# Patient Record
Sex: Female | Born: 1985 | Race: White | Hispanic: No | Marital: Single | Smoking: Current every day smoker
Health system: Southern US, Community
[De-identification: ages and names within clinical notes are randomized; demographics above are authoritative.]

## PROBLEM LIST (undated history)

## (undated) ENCOUNTER — Inpatient Hospital Stay (HOSPITAL_COMMUNITY): Payer: Self-pay

## (undated) DIAGNOSIS — I10 Essential (primary) hypertension: Secondary | ICD-10-CM

## (undated) DIAGNOSIS — F329 Major depressive disorder, single episode, unspecified: Secondary | ICD-10-CM

## (undated) DIAGNOSIS — G43909 Migraine, unspecified, not intractable, without status migrainosus: Secondary | ICD-10-CM

## (undated) DIAGNOSIS — F32A Depression, unspecified: Secondary | ICD-10-CM

## (undated) HISTORY — PX: MOUTH SURGERY: SHX715

## (undated) HISTORY — PX: HERNIA REPAIR: SHX51

## (undated) HISTORY — PX: APPENDECTOMY: SHX54

## (undated) HISTORY — PX: ANKLE SURGERY: SHX546

---

## 2006-04-25 ENCOUNTER — Emergency Department (HOSPITAL_COMMUNITY): Admission: EM | Admit: 2006-04-25 | Discharge: 2006-04-26 | Payer: Self-pay | Admitting: Emergency Medicine

## 2006-05-09 ENCOUNTER — Emergency Department (HOSPITAL_COMMUNITY): Admission: EM | Admit: 2006-05-09 | Discharge: 2006-05-09 | Payer: Self-pay | Admitting: Emergency Medicine

## 2006-06-29 ENCOUNTER — Emergency Department (HOSPITAL_COMMUNITY): Admission: EM | Admit: 2006-06-29 | Discharge: 2006-06-30 | Payer: Self-pay | Admitting: Emergency Medicine

## 2006-07-19 ENCOUNTER — Emergency Department (HOSPITAL_COMMUNITY): Admission: EM | Admit: 2006-07-19 | Discharge: 2006-07-19 | Payer: Self-pay | Admitting: Emergency Medicine

## 2006-08-06 ENCOUNTER — Emergency Department (HOSPITAL_COMMUNITY): Admission: EM | Admit: 2006-08-06 | Discharge: 2006-08-06 | Payer: Self-pay | Admitting: Emergency Medicine

## 2006-08-30 HISTORY — PX: APPENDECTOMY: SHX54

## 2006-09-05 ENCOUNTER — Emergency Department (HOSPITAL_COMMUNITY): Admission: EM | Admit: 2006-09-05 | Discharge: 2006-09-05 | Payer: Self-pay | Admitting: Emergency Medicine

## 2006-09-20 ENCOUNTER — Encounter: Admission: RE | Admit: 2006-09-20 | Discharge: 2006-09-20 | Payer: Self-pay | Admitting: Internal Medicine

## 2006-10-13 ENCOUNTER — Emergency Department (HOSPITAL_COMMUNITY): Admission: EM | Admit: 2006-10-13 | Discharge: 2006-10-13 | Payer: Self-pay | Admitting: Emergency Medicine

## 2006-10-23 ENCOUNTER — Emergency Department (HOSPITAL_COMMUNITY): Admission: EM | Admit: 2006-10-23 | Discharge: 2006-10-23 | Payer: Self-pay | Admitting: Emergency Medicine

## 2006-10-28 ENCOUNTER — Ambulatory Visit (HOSPITAL_COMMUNITY): Admission: RE | Admit: 2006-10-28 | Discharge: 2006-10-28 | Payer: Self-pay | Admitting: Gastroenterology

## 2006-11-09 ENCOUNTER — Emergency Department (HOSPITAL_COMMUNITY): Admission: EM | Admit: 2006-11-09 | Discharge: 2006-11-10 | Payer: Self-pay | Admitting: Emergency Medicine

## 2006-11-11 ENCOUNTER — Emergency Department (HOSPITAL_COMMUNITY): Admission: EM | Admit: 2006-11-11 | Discharge: 2006-11-11 | Payer: Self-pay | Admitting: Emergency Medicine

## 2006-11-15 ENCOUNTER — Emergency Department (HOSPITAL_COMMUNITY): Admission: EM | Admit: 2006-11-15 | Discharge: 2006-11-15 | Payer: Self-pay | Admitting: Emergency Medicine

## 2006-12-07 ENCOUNTER — Emergency Department (HOSPITAL_COMMUNITY): Admission: EM | Admit: 2006-12-07 | Discharge: 2006-12-07 | Payer: Self-pay | Admitting: Emergency Medicine

## 2006-12-21 ENCOUNTER — Emergency Department (HOSPITAL_COMMUNITY): Admission: EM | Admit: 2006-12-21 | Discharge: 2006-12-21 | Payer: Self-pay | Admitting: Emergency Medicine

## 2006-12-29 ENCOUNTER — Encounter: Admission: RE | Admit: 2006-12-29 | Discharge: 2006-12-29 | Payer: Self-pay | Admitting: General Surgery

## 2007-01-04 ENCOUNTER — Ambulatory Visit (HOSPITAL_COMMUNITY): Admission: RE | Admit: 2007-01-04 | Discharge: 2007-01-04 | Payer: Self-pay | Admitting: General Surgery

## 2007-01-04 ENCOUNTER — Encounter (INDEPENDENT_AMBULATORY_CARE_PROVIDER_SITE_OTHER): Payer: Self-pay | Admitting: Specialist

## 2007-01-05 ENCOUNTER — Inpatient Hospital Stay (HOSPITAL_COMMUNITY): Admission: EM | Admit: 2007-01-05 | Discharge: 2007-01-11 | Payer: Self-pay | Admitting: Emergency Medicine

## 2007-03-12 ENCOUNTER — Emergency Department (HOSPITAL_COMMUNITY): Admission: EM | Admit: 2007-03-12 | Discharge: 2007-03-12 | Payer: Self-pay | Admitting: Emergency Medicine

## 2007-03-20 ENCOUNTER — Emergency Department (HOSPITAL_COMMUNITY): Admission: EM | Admit: 2007-03-20 | Discharge: 2007-03-20 | Payer: Self-pay | Admitting: Emergency Medicine

## 2007-04-17 ENCOUNTER — Emergency Department (HOSPITAL_COMMUNITY): Admission: EM | Admit: 2007-04-17 | Discharge: 2007-04-18 | Payer: Self-pay | Admitting: Emergency Medicine

## 2007-09-11 ENCOUNTER — Emergency Department (HOSPITAL_COMMUNITY): Admission: EM | Admit: 2007-09-11 | Discharge: 2007-09-12 | Payer: Self-pay | Admitting: Emergency Medicine

## 2007-10-06 ENCOUNTER — Emergency Department (HOSPITAL_COMMUNITY): Admission: EM | Admit: 2007-10-06 | Discharge: 2007-10-06 | Payer: Self-pay | Admitting: Emergency Medicine

## 2008-06-18 IMAGING — RF DG BE W/ CM - WO/W KUB
14 of 23 series · 14 of 24 positions shown · non-contrast
Comparison: none

CLINICAL DATA: Right lower quadrant pain. Evaluate for possible diverticulum.

 KUB WITH BARIUM ENEMA FULL COLUMN:

[Series 1: run · 1 of 3 slices shown (1 of 10)]
[im 1/3]
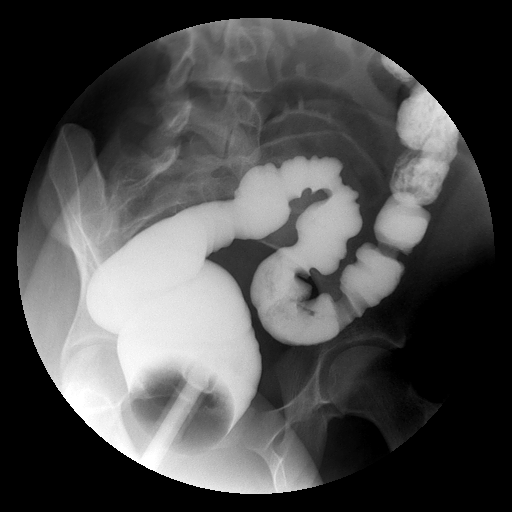

[Series 2: run · 1 of 3 slices shown (2 of 10)]
[im 1/3]
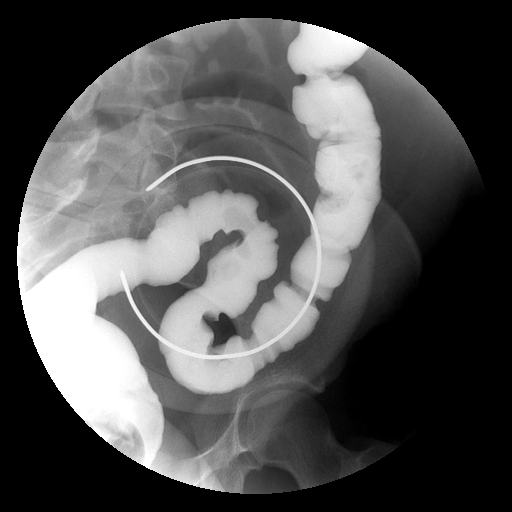

[Series 4: run · 1 of 3 slices shown (3 of 10)]
[im 1/3]
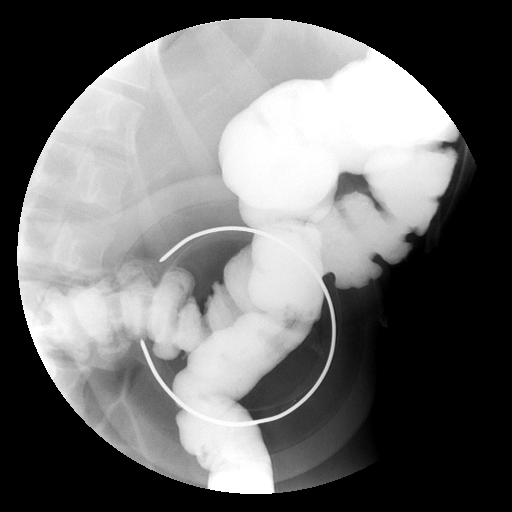

[Series 6: run · 1 of 3 slices shown (4 of 10)]
[im 1/3]
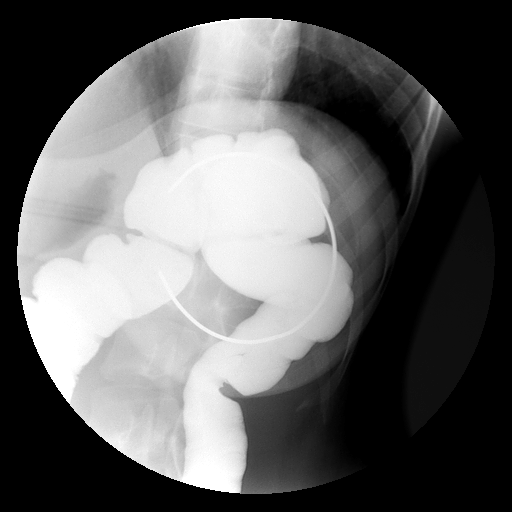

[Series 7: run · 1 of 3 slices shown (5 of 10)]
[im 1/3]
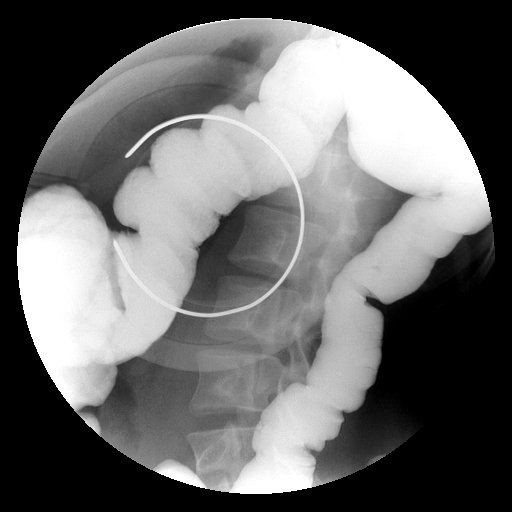

[Series 9: run · 1 of 3 slices shown (6 of 10)]
[im 1/3]
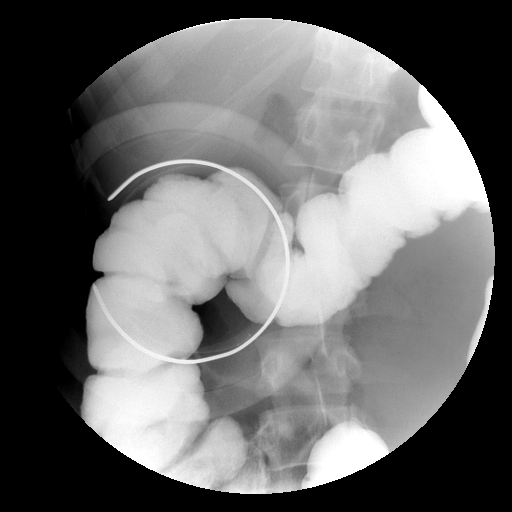

[Series 11: run · 1 of 3 slices shown (7 of 10)]
[im 1/3]
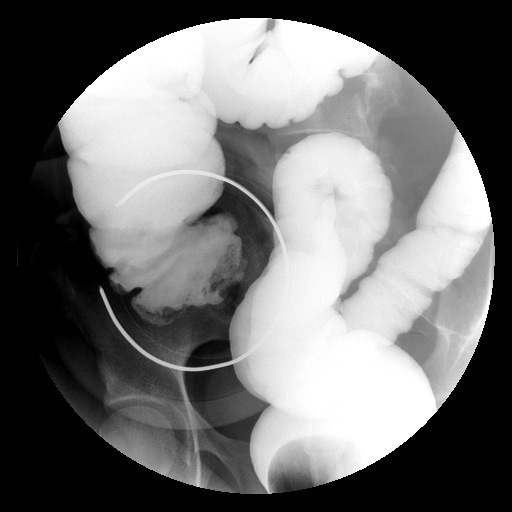

[Series 12: run · 1 of 3 slices shown (8 of 10)]
[im 1/3]
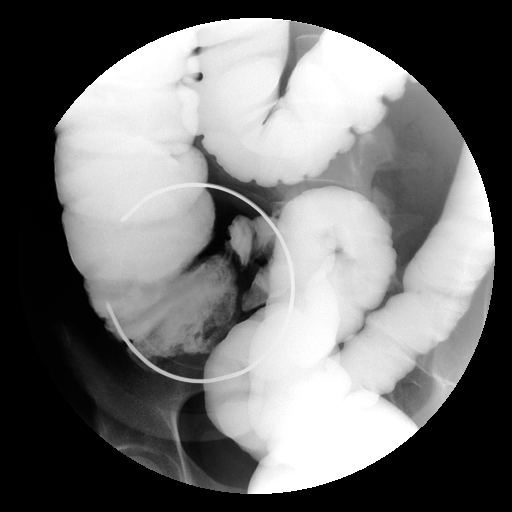

[Series 14: run · 1 of 3 slices shown (9 of 10)]
[im 1/3]
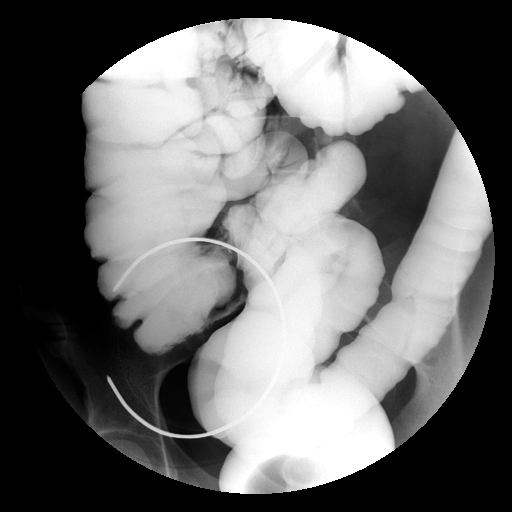

[Series 16: run · 1 of 3 slices shown (10 of 10)]
[im 1/3]
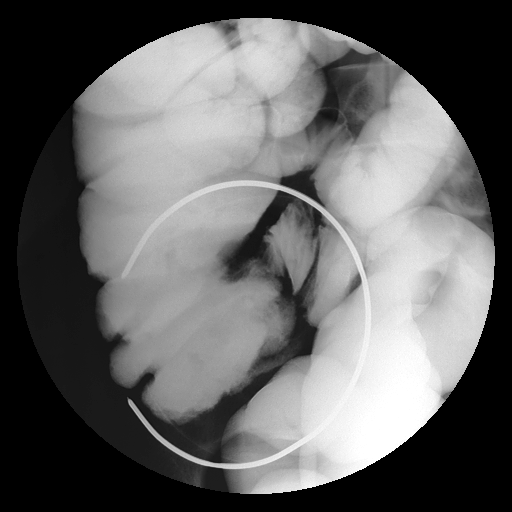

[Series 1001: view not recorded · 0.20mm/px · 1 of 1 slices shown (1 of 4)]
[im 1/1]
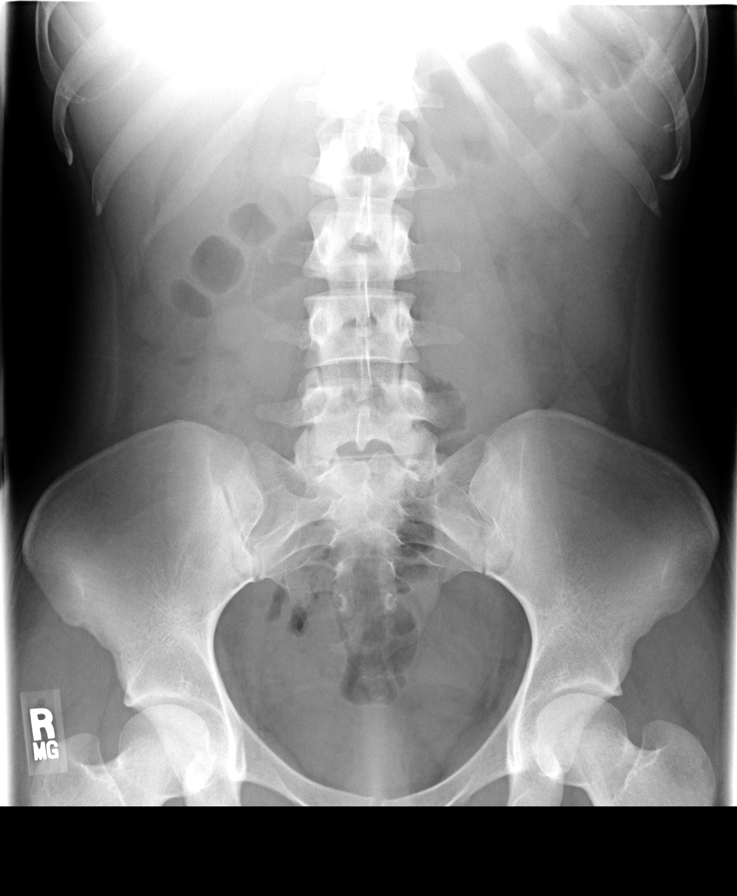

[Series 1002: view not recorded · 0.20mm/px · 1 of 1 slices shown (2 of 4)]
[im 1/1]
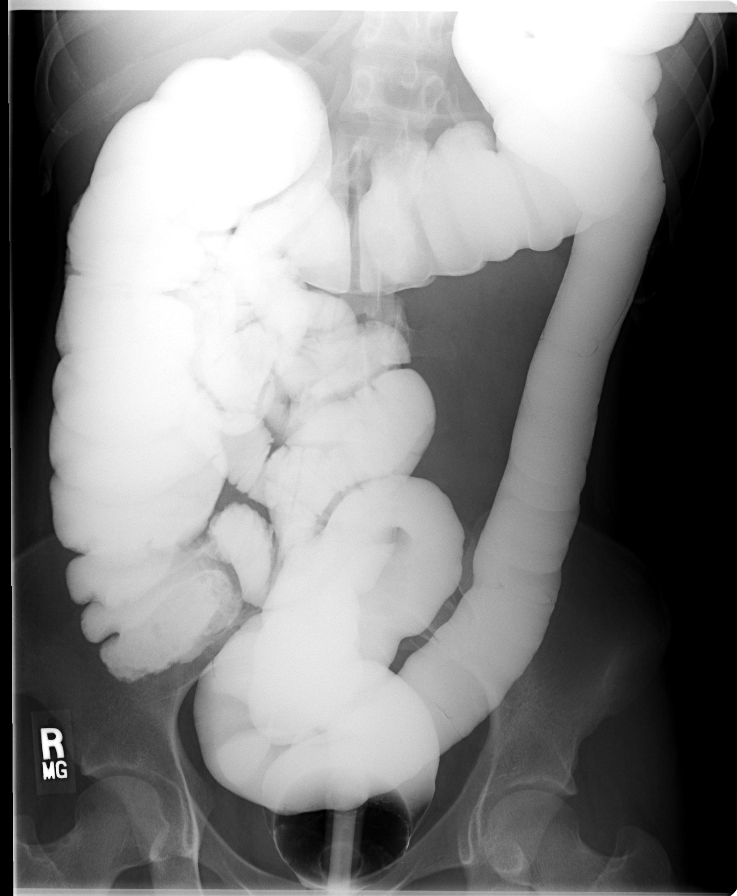

[Series 1004: view not recorded · 0.20mm/px · 1 of 1 slices shown (3 of 4)]
[im 1/1]
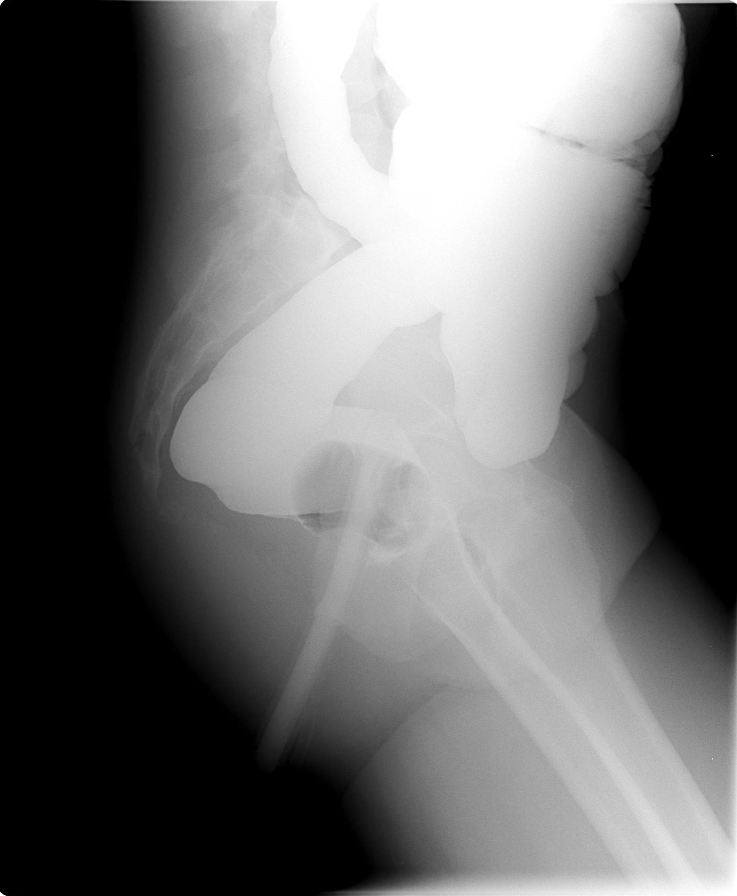

[Series 1006: view not recorded · 0.20mm/px · 1 of 1 slices shown (4 of 4)]
[im 1/1]
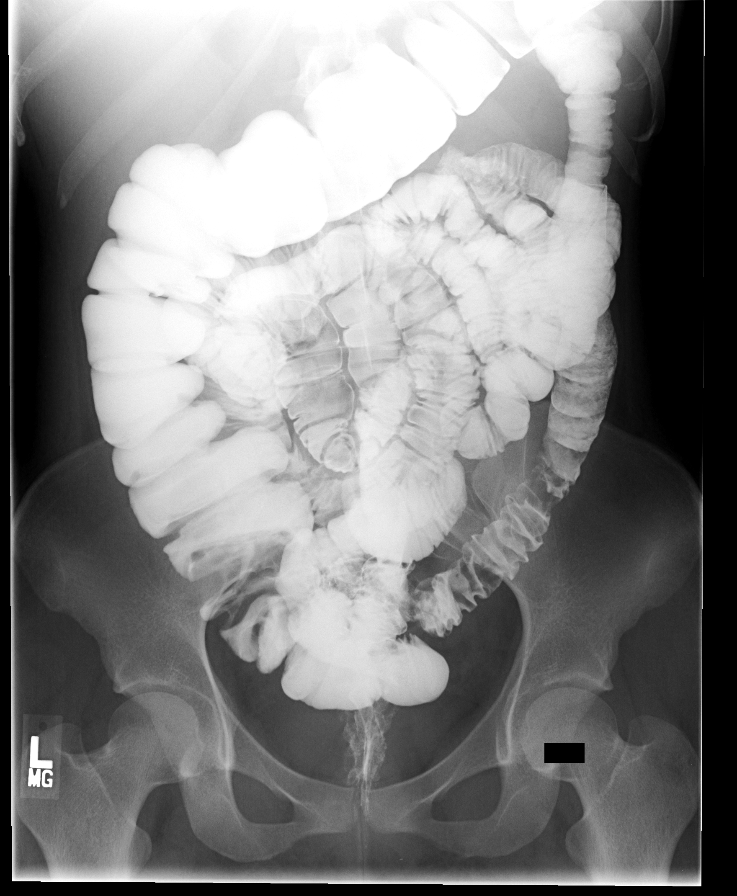

[14 of 24 positions shown; findings below may reference images not displayed]

FINDINGS: KUB:  A preliminary film of the abdomen shows a nonspecific bowel gas pattern.  No opaque calculi are noted.  
 BARIUM ENEMA:  A single contrast barium enema was performed.  There is some retained feces throughout the colon making exclusions of polyps difficult.  However, no definite persistent polypoid lesion or constricting lesion is seen.  There is some feces in the base of the cecum making that area difficult to assess.  However, no cecal diverticulum is noted and the terminal ileum is well-visualized and appears normal.
IMPRESSION: 1.  Negative barium enema.  Some retained feces makes exclusion of small polyps difficult, but no persistent polypoid lesion or constricting lesion is seen.
 2.  Terminal ileum appears normal.

## 2008-08-28 ENCOUNTER — Ambulatory Visit: Payer: Self-pay | Admitting: Family

## 2008-08-28 ENCOUNTER — Inpatient Hospital Stay (HOSPITAL_COMMUNITY): Admission: AD | Admit: 2008-08-28 | Discharge: 2008-08-28 | Payer: Self-pay | Admitting: Obstetrics & Gynecology

## 2008-08-30 HISTORY — PX: HERNIA REPAIR: SHX51

## 2008-09-07 ENCOUNTER — Inpatient Hospital Stay (HOSPITAL_COMMUNITY): Admission: AD | Admit: 2008-09-07 | Discharge: 2008-09-07 | Payer: Self-pay | Admitting: Obstetrics & Gynecology

## 2008-09-07 ENCOUNTER — Ambulatory Visit: Payer: Self-pay | Admitting: Advanced Practice Midwife

## 2008-09-23 ENCOUNTER — Inpatient Hospital Stay (HOSPITAL_COMMUNITY): Admission: AD | Admit: 2008-09-23 | Discharge: 2008-09-25 | Payer: Self-pay | Admitting: Obstetrics & Gynecology

## 2008-09-23 ENCOUNTER — Ambulatory Visit: Payer: Self-pay | Admitting: Obstetrics and Gynecology

## 2009-01-21 ENCOUNTER — Ambulatory Visit (HOSPITAL_COMMUNITY): Admission: RE | Admit: 2009-01-21 | Discharge: 2009-01-21 | Payer: Self-pay | Admitting: General Surgery

## 2009-01-28 ENCOUNTER — Emergency Department (HOSPITAL_COMMUNITY): Admission: EM | Admit: 2009-01-28 | Discharge: 2009-01-28 | Payer: Self-pay | Admitting: Emergency Medicine

## 2009-02-04 ENCOUNTER — Emergency Department (HOSPITAL_COMMUNITY): Admission: EM | Admit: 2009-02-04 | Discharge: 2009-02-04 | Payer: Self-pay | Admitting: Emergency Medicine

## 2009-02-14 ENCOUNTER — Emergency Department (HOSPITAL_COMMUNITY): Admission: EM | Admit: 2009-02-14 | Discharge: 2009-02-14 | Payer: Self-pay | Admitting: Emergency Medicine

## 2009-02-21 ENCOUNTER — Ambulatory Visit (HOSPITAL_COMMUNITY): Admission: RE | Admit: 2009-02-21 | Discharge: 2009-02-21 | Payer: Self-pay | Admitting: Obstetrics

## 2009-02-21 ENCOUNTER — Emergency Department (HOSPITAL_COMMUNITY): Admission: EM | Admit: 2009-02-21 | Discharge: 2009-02-21 | Payer: Self-pay | Admitting: Emergency Medicine

## 2009-03-21 ENCOUNTER — Encounter: Admission: RE | Admit: 2009-03-21 | Discharge: 2009-03-21 | Payer: Self-pay | Admitting: General Surgery

## 2009-06-04 ENCOUNTER — Emergency Department (HOSPITAL_COMMUNITY): Admission: EM | Admit: 2009-06-04 | Discharge: 2009-06-04 | Payer: Self-pay | Admitting: Emergency Medicine

## 2009-06-27 ENCOUNTER — Ambulatory Visit (HOSPITAL_COMMUNITY): Admission: RE | Admit: 2009-06-27 | Discharge: 2009-06-27 | Payer: Self-pay | Admitting: Obstetrics

## 2009-07-28 ENCOUNTER — Emergency Department (HOSPITAL_COMMUNITY): Admission: EM | Admit: 2009-07-28 | Discharge: 2009-07-28 | Payer: Self-pay | Admitting: Emergency Medicine

## 2009-08-18 ENCOUNTER — Ambulatory Visit (HOSPITAL_COMMUNITY): Admission: RE | Admit: 2009-08-18 | Discharge: 2009-08-18 | Payer: Self-pay | Admitting: Obstetrics

## 2009-11-05 ENCOUNTER — Emergency Department (HOSPITAL_COMMUNITY): Admission: EM | Admit: 2009-11-05 | Discharge: 2009-11-05 | Payer: Self-pay | Admitting: Emergency Medicine

## 2010-09-20 ENCOUNTER — Encounter: Payer: Self-pay | Admitting: Obstetrics

## 2010-09-20 ENCOUNTER — Encounter: Payer: Self-pay | Admitting: Internal Medicine

## 2010-12-02 LAB — URINALYSIS, ROUTINE W REFLEX MICROSCOPIC
Bilirubin Urine: NEGATIVE
Glucose, UA: NEGATIVE mg/dL
Hgb urine dipstick: NEGATIVE
Ketones, ur: NEGATIVE mg/dL
Protein, ur: NEGATIVE mg/dL

## 2010-12-07 LAB — URINALYSIS, ROUTINE W REFLEX MICROSCOPIC
Bilirubin Urine: NEGATIVE
Glucose, UA: NEGATIVE mg/dL
Glucose, UA: NEGATIVE mg/dL
Glucose, UA: NEGATIVE mg/dL
Hgb urine dipstick: NEGATIVE
Hgb urine dipstick: NEGATIVE
Hgb urine dipstick: NEGATIVE
Ketones, ur: NEGATIVE mg/dL
Nitrite: NEGATIVE
Protein, ur: NEGATIVE mg/dL
Specific Gravity, Urine: 1.028 (ref 1.005–1.030)
Specific Gravity, Urine: 1.015 (ref 1.005–1.030)
Urobilinogen, UA: 1 mg/dL (ref 0.0–1.0)
pH: 6.5 (ref 5.0–8.0)

## 2010-12-07 LAB — URINE MICROSCOPIC-ADD ON

## 2010-12-07 LAB — DIFFERENTIAL
Basophils Absolute: 0.1 10*3/uL (ref 0.0–0.1)
Basophils Relative: 0 % (ref 0–1)
Eosinophils Absolute: 0.2 10*3/uL (ref 0.0–0.7)
Eosinophils Relative: 2 % (ref 0–5)
Lymphocytes Relative: 26 % (ref 12–46)
Lymphs Abs: 2.3 10*3/uL (ref 0.7–4.0)
Monocytes Absolute: 0.4 10*3/uL (ref 0.1–1.0)
Monocytes Relative: 5 % (ref 3–12)
Monocytes Relative: 6 % (ref 3–12)
Neutro Abs: 3.8 10*3/uL (ref 1.7–7.7)
Neutrophils Relative %: 56 % (ref 43–77)

## 2010-12-07 LAB — GC/CHLAMYDIA PROBE AMP, GENITAL: GC Probe Amp, Genital: NEGATIVE

## 2010-12-07 LAB — PREGNANCY, URINE: Preg Test, Ur: NEGATIVE

## 2010-12-07 LAB — COMPREHENSIVE METABOLIC PANEL
AST: 17 U/L (ref 0–37)
Albumin: 4.7 g/dL (ref 3.5–5.2)
BUN: 9 mg/dL (ref 6–23)
CO2: 24 mEq/L (ref 19–32)
Calcium: 9.8 mg/dL (ref 8.4–10.5)
Chloride: 109 mEq/L (ref 96–112)
Creatinine, Ser: 0.73 mg/dL (ref 0.4–1.2)
GFR calc Af Amer: >60 mL/min (ref >60)
GFR calc non Af Amer: >60 mL/min (ref >60)
Glucose, Bld: 87 mg/dL (ref 70–99)
Potassium: 3.8 mEq/L (ref 3.5–5.1)
Total Bilirubin: 0.6 mg/dL (ref 0.3–1.2)
Total Protein: 7.7 g/dL (ref 6.0–8.3)
Total Protein: 7.9 g/dL (ref 6.0–8.3)

## 2010-12-07 LAB — CBC
HCT: 37.4 % (ref 36.0–46.0)
Hemoglobin: 12.8 g/dL (ref 12.0–15.0)
MCHC: 34.1 g/dL (ref 30.0–36.0)
MCV: 94.3 fL (ref 78.0–100.0)
MCV: 94.6 fL (ref 78.0–100.0)
Platelets: 359 10*3/uL (ref 150–400)
RBC: 3.96 MIL/uL (ref 3.87–5.11)
RDW: 13.8 % (ref 11.5–15.5)
WBC: 7.6 10*3/uL (ref 4.0–10.5)

## 2010-12-07 LAB — WET PREP, GENITAL: Yeast Wet Prep HPF POC: NONE SEEN

## 2010-12-07 LAB — URINE CULTURE: Colony Count: >=100000

## 2010-12-07 LAB — RPR: RPR Ser Ql: NONREACTIVE

## 2010-12-08 LAB — CBC
Hemoglobin: 12.3 g/dL (ref 12.0–15.0)
MCHC: 34.6 g/dL (ref 30.0–36.0)
Platelets: 348 10*3/uL (ref 150–400)
RDW: 14.9 % (ref 11.5–15.5)

## 2010-12-08 LAB — URINALYSIS, ROUTINE W REFLEX MICROSCOPIC
Glucose, UA: NEGATIVE mg/dL
Hgb urine dipstick: NEGATIVE
Ketones, ur: NEGATIVE mg/dL
Protein, ur: NEGATIVE mg/dL
pH: 6.5 (ref 5.0–8.0)

## 2010-12-08 LAB — DIFFERENTIAL
Basophils Relative: 0 % (ref 0–1)
Eosinophils Absolute: 0.1 10*3/uL (ref 0.0–0.7)
Lymphs Abs: 1.9 10*3/uL (ref 0.7–4.0)
Monocytes Absolute: 0.3 10*3/uL (ref 0.1–1.0)
Monocytes Relative: 7 % (ref 3–12)

## 2010-12-08 LAB — COMPREHENSIVE METABOLIC PANEL
ALT: 8 U/L (ref 0–35)
Albumin: 4 g/dL (ref 3.5–5.2)
Alkaline Phosphatase: 48 U/L (ref 39–117)
Calcium: 9.1 mg/dL (ref 8.4–10.5)
GFR calc Af Amer: >60 mL/min (ref >60)
Potassium: 3.5 mEq/L (ref 3.5–5.1)
Sodium: 141 mEq/L (ref 135–145)
Total Protein: 6.5 g/dL (ref 6.0–8.3)

## 2010-12-14 LAB — COMPREHENSIVE METABOLIC PANEL
ALT: 9 U/L (ref 0–35)
AST: 14 U/L (ref 0–37)
Albumin: 2.8 g/dL — ABNORMAL LOW (ref 3.5–5.2)
Alkaline Phosphatase: 106 U/L (ref 39–117)
Calcium: 8.5 mg/dL (ref 8.4–10.5)
GFR calc Af Amer: >60 mL/min (ref >60)
Potassium: 3.4 mEq/L — ABNORMAL LOW (ref 3.5–5.1)
Sodium: 138 mEq/L (ref 135–145)
Total Protein: 6.2 g/dL (ref 6.0–8.3)

## 2010-12-14 LAB — CULTURE, BETA STREP (GROUP B ONLY)

## 2010-12-14 LAB — CBC
Hemoglobin: 10.3 g/dL — ABNORMAL LOW (ref 12.0–15.0)
MCHC: 34.4 g/dL (ref 30.0–36.0)
RDW: 14.5 % (ref 11.5–15.5)

## 2010-12-14 LAB — GLUCOSE, CAPILLARY: Glucose-Capillary: 76 mg/dL (ref 70–99)

## 2011-01-12 NOTE — Consult Note (Signed)
Carmen Noble, Carmen Noble              ACCOUNT NO.:  [id number]   MEDICAL RECORD NO.:  [id number]          PATIENT TYPE:  INP   LOCATION:  1338                         FACILITY:  [facility]   PHYSICIAN:  Bernette Redbird, M.D.   DATE OF BIRTH:  [birth year]   DATE OF CONSULTATION:  01/09/2007  DATE OF DISCHARGE:                                 CONSULTATION   This is a gastroenterology consultation.   Dr. Derrell Lolling asked [place] to see this 25 year old female because of abdominal  pain and diarrhea.   [name] is known to my partner, Dr. Randa Evens, who has followed her for  some time and has attempted to find an etiology for her significant  recurring right lower quadrant pain, but has been unable to do so  despite extensive testing.  This has included a total of 4 CT scans of  the abdomen and pelvis since August, most recently on the current  admission, which have shown on a consistent basis a question of some  distal ileal thickening, although this was not shown to be abnormal on a  small bowel series, nor was colonoscopy to the terminal ileum revealing  for any obvious colitis or ileitis.   Because of her ongoing symptoms and an ongoing narcotic requirement, the  patient underwent laparoscopy by Dr. Derrell Lolling about a week ago which was  normal.  The appendix was removed empirically and was normal both to  inspection and histologically.  The small bowel was run and no Meckel's  diverticulum or evidence of Crohn disease or problem with adhesions was  identified.   The patient has been seen by Dr. Coral Ceo with a questionable  diagnosis of PID and a basically negative pelvic ultrasound.   It is not clear if the patient has had testing for celiac disease from  the information that I have currently available.   The patient describes her symptoms as not having improved since August  and they have been especially bad recently.  The patient had her surgery  about a week ago, was discharged home for a  few days and then  readmitted.  The pain is not present on a daily basis but rather  sporadic and probably effects her roughly 15 days out of a month.  Typically it bothers her mostly in the morning, although here in the  hospital it has been pretty much around the clock.  Typically, it lasts  most of the morning, which is to say for several hours, and is of  sufficient severity to interfere with normal activities to some degree.  It is usually sharp and stabbing and somewhat crampy in character and is  localized in the right lower quadrant over an area of about 10 cm or so,  without any radiation to the groin, back, etc.  It seems to be somewhat  worse during her menstrual cycle and can be helped a little bit by  splinting the abdomen with her hand.  There is no obvious association  with meals.   The patient reports a roughly 25-pound weight loss over the past year.   The symptoms do  not bother her after going to sleep at night.   In addition to the pain, the patient has had altered bowel habit.  Instead of her usual baseline bowel habit of 2 well-formed bowel  movements a day, the patient since January has been having roughly 6  bowel movements a day, predominantly in the morning, typically  progressively looser with each successive bowel movement.  The bowel  movements have been nonbloody and are not associated with significant  cramps.   Constitutionally, the patient says her appetite is fine despite the  significant weight loss.  No problem with constitutional symptoms such  as arthralgias, skin rashes, etc.   Of note, the patient has used large amounts of Vicodin, perhaps 300  Vicodin tablets in the past 6 weeks, per discussion with Dr. Derrell Lolling when  he phoned in the consult.   PAST MEDICAL HISTORY:  ALLERGY TO PENICILLIN.   CURRENT MEDICATIONS IN THE HOSPITAL:  Cipro, morphine, Zofran, Protonix.   OPERATIONS:  None other than the above-mentioned laparoscopy about a  week  ago.   CHRONIC MEDICAL ILLNESSES:  None.   HABITS:  Smokes about one pack per day, nondrinker, no street drugs.   FAMILY HISTORY:  Negative for GI illnesses.   SOCIAL HISTORY:  The patient lives with her parents.  She does not work.  She has a 88-year-old child.  Her sister and her sister's children have  moved in with them as well.   REVIEW OF SYSTEMS:  See HPI.   PHYSICAL EXAMINATION:  Unrevealing.  A pleasant, healthy-appearing  Caucasian female in no evident distress appearing neither anxious nor  depressed.  She is anicteric and without pallor.  CHEST:  Clear.  HEART:  Normal  ABDOMEN:  Normal bowel sounds, is soft, nondistended without  organomegaly or any significant guarding or significant tenderness.  She  is slightly touchy to palpation but nothing impressive.   LABORATORIES:  White count 3 days ago was 6000; today it is 12,600.  Hemoglobin was 11.3, now 13.1.  Platelets 316,000, 82 polys, 9 lymphs, 8  monocytes.  Sed rate normal at 19.  Chemistry panel unremarkable  including normal liver chemistries and normal albumin.  Urine pregnancy  test negative.  Urinalysis showed trace ketones on admission and trace  leukocytes.   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   RECOMMENDATIONS:  1. Await stool study results.  If they are negative, I would stop the      patient's Cipro.  2. I would consider video capsule endoscopy although I think the yield      would be very low.  3. I had a long talk with the patient regarding chronic abdominal pain      and its management and how it differs from acute abdominal pain.  4. As an outpatient, the patient could be tried on Align probiotic      therapy to see if it might help her altered bowel habits. 5. I would probably check a tissue transglutaminase to help be sure      that this is not celiac disease  (doubt).  6. I would consider pain clinic consultation to help the patient      transition from narcotic analgesics to centrally acting pain-      modulating therapy (for example, low dose antidepressant      medicines), as well as  consideration of alternative or adjunctive      measures such as acupuncture, relaxation therapy, etc.  7. It might be reasonable to ask Dr. Jeanie Sewer of liaison psychiatry      to screen the patient for depressive and/or somatoform disorders      that might be contributing to her pain presentation, although I did      not get an impression, from my visit with the patient this evening,      that such problems are present.  8. From our standpoint, it would be okay to advance the patient's diet      from clear liquids to a low residue diet at this time; she states      she is hungry.  9. I emphasized to the patient that there is a strong possibility that      we will not find a specific cause for her pain, that her prognosis      is good over time that it will not evolve into a dangerous or      severe as-yet undiagnosed pathologic disorder, and that the goal of      therapy should be oriented primarily toward modulation of the pain      but not elimination of the pain, and improving her functional      status and ability to cope with the symptoms.   A lot of this patient's management will probably need to be continued on  an outpatient basis under the direction of her primary  gastroenterologist, Dr. Vilinda Boehringer.           ______________________________  Bernette Redbird, M.D.     RB/MEDQ  D:  01/09/2007  T:  01/10/2007  Job:  [id number]   cc:   Fayrene Fearing L. Malon Kindle., M.D.  Fax: [phone]   Charles A. Clearance Coots, M.D.  Fax: [phone]   Angelia Mould. Derrell Lolling, M.D.  [address]  [place]  [place] [address]   Fleet Contras, M.D.  Fax: [phone]

## 2011-01-12 NOTE — Op Note (Signed)
Carmen Noble, Carmen Noble              ACCOUNT NO.:  [id number]   MEDICAL RECORD NO.:  [id number]          PATIENT TYPE:  AMB   LOCATION:  DAY                          FACILITY:  [facility]   PHYSICIAN:  Angelia Mould. Derrell Lolling, M.D.DATE OF BIRTH:  [birth year]   DATE OF PROCEDURE:  01/21/2009  DATE OF DISCHARGE:                               OPERATIVE REPORT   PREOPERATIVE DIAGNOSIS:  Ventral incisional hernia.   POSTOPERATIVE DIAGNOSIS:  Ventral incisional hernia.   OPERATION PERFORMED:  Laparoscopic repair of ventral incisional hernia  with Parietex Composite Mesh (12-cm circular).   SURGEON:  Dr. Claud Kelp.   OPERATIVE INDICATIONS:  This is a 25 year old Caucasian female who has  had several pregnancies.  She was evaluated 2 years ago for chronic  right lower quadrant pain.  After exhaustive workup, we did an elective  laparoscopic appendectomy.  Her appendix was normal.  There were no  other abnormalities.  She recovered from that surgery.  She has had no  more GI problems.  She presented recently with a painful bulge just  above the umbilicus, and on exam, she has a small hernia above her  umbilicus in the midline.  This is thought to possibly be related to her  laparoscopic surgery.  She is brought to the operating room electively  for repair because this is painful.   OPERATIVE FINDINGS:  There was a small ventral incisional hernia above  the umbilicus in the midline.  The falciform ligament was quite large  and came all the way down to the hernia and required extensive  debridement of the falciform ligament to get the mesh properly sutured  in place.  The defect in the fascia was probably less than 3 cm.  We saw  no other evidence of hernias elsewhere.   OPERATIVE TECHNIQUE:  Following the induction of general endotracheal  anesthesia, the patient's bladder was emptied with in-and-out  catheterization.  Intravenous antibiotics were given.  The abdomen was  prepped and draped in a  sterile fashion.  The patient identified as  correct patient and correct procedure and correct site.  Marcaine 0.50%  with epinephrine was used as local infiltration anesthetic.  A 5-mm  optical port was placed in the left subcostal region.  Entry was under  direct vision.  Entry was unremarkable and without trauma.  Pneumoperitoneum was created.  Video cam was inserted.  There is no sign  of any injury to any of the abdominal viscera, and there was no  bleeding.  An 11-mm trocar was placed in the left lateral abdomen and a  5-mm trocar was placed in left lower quadrant.   We surveyed the entire abdomen and pelvis and found no other  abnormalities.  We examined the hernia defect.  We used a harmonic  scalpel to take down the falciform ligament which was quite large and  bulky and took this all the way back up to just below the xiphoid  process.  We had good hemostasis.  We marked the defect with a spinal  needle and found that it was about 3 cm at most.  We  chose to use a 12-  cm diameter Parietex Composite Mesh so that it would cover the defect as  well as the umbilical area extensively.  This mesh was brought to the  operative field.  We drew a circular template on the abdominal wall and  marked the mesh for 6 equidistant suture fixation sites.  We used 0  Novofil and placed 6 Novofil sutures of the edge of the mesh on the  rough side.  We then moistened the mesh, rolled it up and then inserted  into the abdominal cavity.  We then spread the mesh out and oriented  according to the templated picture.  We made small incisions at the 6  suture fixation sites, and we brought the Novofil sutures up through the  suture fixation sites.  We were careful to take about 1-cm bite of  fascia at each place to get good secure fixation.  After we placed all  of these sutures, we lifted the mesh up.  We could see the smooth  visceral surface of the mesh, and this covered the defect quite nicely.  It  was smooth and had no folds or fluting.  We tied all 6 suture  fixation.  We then used the 5-mm titanium ProTack to fix the mesh to the  abdominal wall further.  We did this in a double crown fashion with an  outer rim and an  inner rim.  The outer rim placed the screw tacks by  about 1 cm apart and internally placed about 6 or 8 screw tacks.  This  secured the mesh quite nicely to the abdominal wall.  There was no  bleeding.  We surveyed the abdomen and saw no evidence of any injury to  any other organ.  The trocars were removed under direct vision.  There  was no bleeding from trocar sites.  The pneumoperitoneum was released.  The skin incisions were closed with subcuticular sutures of 4-0 Monocryl  and Steri-Strips.  Clean bandages were placed, and the patient taken to  the recovery room in stable condition.  Estimated blood loss about 10  mL.  Complications none.  Sponge, needle and instrument counts were  correct.      Angelia Mould. Derrell Lolling, M.D.  Electronically Signed     HMI/MEDQ  D:  01/21/2009  T:  01/21/2009  Job:  [id number]   cc:   Roseanna Rainbow, M.D.  Fax: [phone]

## 2011-01-12 NOTE — Op Note (Signed)
Carmen Noble, Carmen Noble              ACCOUNT NO.:  [id number]   MEDICAL RECORD NO.:  [id number]          PATIENT TYPE:  AMB   LOCATION:  DAY                          FACILITY:  [facility]   PHYSICIAN:  Angelia Mould. Derrell Lolling, M.D.DATE OF BIRTH:  1986-01-15   DATE OF PROCEDURE:  01/04/2007  DATE OF DISCHARGE:                               OPERATIVE REPORT   PREOPERATIVE DIAGNOSIS:  Right lower quadrant pain, uncertain etiology.   POSTOPERATIVE DIAGNOSIS:  1. Right lower quadrant pain, uncertain etiology.  2. Question tiny fibroma of right fallopian tube.  3. Possible early left inguinal hernia.   OPERATION PERFORMED:  1. Diagnostic laparoscopy.  2. Laparoscopic appendectomy.   SURGEON:  Dr. Claud Kelp   FIRST ASSISTANT:  Dr. Harriette Bouillon   OPERATIVE INDICATIONS:  This is a 25 year old white female who gives a 9-  month history of right lower quadrant pain.  She stated this started  rather suddenly one day, it was intermittent at first and is now more  constant and a daily problem.  It waxes and wanes but never goes away.  She states that she occasionally has nausea and vomiting in the morning.  She occasionally has diarrhea.  She has lost 15 pounds over the past  year. Her health was otherwise stable.  She has been evaluated by Dr.  Coral Ceo and actually was treated for PID although the diagnosis  was not clear.  She has had pelvic ultrasounds, ultrasounds of the  gallbladder, CT scans, small-bowel follow-through, colonoscopy, and  barium enema. The CT scan suggested a little bit of thickening of the  terminal ileum but by colonoscopy the terminal ileum was normal and by  barium enema with reflux the terminal ileum was normal. All the small  bowel biopsies and colon biopsies were normal showing no inflammatory  change.  I evaluated her as an outpatient.  She was counseled that the  diagnosis was unclear and whether her pain would resolve after  laparoscopy was unclear.  She  was advised that we would take her  appendix out if no other disease process was found and she was  comfortable with that. She is brought to operating room electively.   OPERATIVE FINDINGS:  There was no clear-cut cause of her pain found.  She may have had a tiny 5 mm fibrous nodule of the fallopian tube on the  right.  She appeared to have a very early left inguinal hernia.  There  were no adhesions.  There was no evidence of endometriosis.  The ovaries  showed no obvious pathology.  The cul-de-sac looked normal.  The  terminal ileum and in fact the entire small bowel was normal.  There was  no inflammatory change.  No evidence of any small bowel diverticula.  The appendix looked normal.  The right colon, transverse colon and  descending colon, sigmoid colon looked normal.  The liver and  gallbladder and stomach looked normal.  There were no adhesions in the  subphrenic spaces.   OPERATIVE TECHNIQUE:  Following induction of general endotracheal  anesthesia a Foley catheter was placed, intravenous antibiotics were  given, the abdomen was prepped and draped, and the patient was  identified as correct patient and correct procedure.  0.5% Marcaine with  epinephrine was used as local infiltration anesthetic.  A transverse  incision was made at the superior rim of the umbilicus.  The fascia was  incised in the midline.  The abdominal cavity entered under direct  vision.  10 mm Hassan cannula was inserted and secured with pursestring  suture of 0 Vicryl.  Pneumoperitoneum was created.  Video cam was  inserted with visualization and findings as described above.  5 mm  trocar was placed in the left mid abdomen and a 12-mm trocar placed in  the left suprapubic area.   We did a thorough exploration of the subphrenic spaces, abdominal cavity  and pelvis with photography taken as mentioned above.  We were only able  to get one copy of the photographs and so they were left on the  patient's  permanent Hospital record.  The findings were described above.  We ran the small bowel from the terminal ileum all the way back to the  ligament of Treitz and saw no pathology.   Since there was no other specific cause of pain found, we elected to  remove the appendix.  We grasped the appendix and lifted it upward.  The  harmonic scalpel was used and we divided the appendiceal mesentery and  appendiceal artery with harmonic scalpel.  We did this in steps until we  had appendix completely skeletonized and could clearly see the junction  of the appendix with the cecum.  An Endo-GIA stapling device was placed  transversely across the base of the appendix.  It was closed, held in  place for 30 seconds, then fired and removed.  The staple line looked  very good, was clean and did not bleed at all.  The appendix was placed  in the specimen bag and removed.  There is absolutely no blood or fluid  present.  The trocars were removed under direct vision.  There is no  bleeding from trocar sites.  Pneumoperitoneum was released.  The fascia  at the umbilicus and the fascia in the suprapubic port site were closed  with 0 Vicryl sutures.  The wounds were irrigated with saline and skin  closed with subcuticular sutures of 4-0 Monocryl and Steri-Strips.  Clean bandages were placed and the patient taken recovery room in stable  condition.  Estimated blood loss was about 10 mL.  Complications none.  Sponge, needle, and instrument counts were correct.      Angelia Mould. Derrell Lolling, M.D.  Electronically Signed     HMI/MEDQ  D:  01/04/2007  T:  01/04/2007  Job:  [id number]   cc:   Fayrene Fearing L. Malon Kindle., M.D.  Fax: [phone]   Charles A. Clearance Coots, M.D.  Fax: [phone]

## 2011-01-15 NOTE — Discharge Summary (Signed)
NAME:  Carmen Noble, Carmen Noble                  ACCOUNT NO.:  [id number]   MEDICAL RECORD NO.:  [id number]          PATIENT TYPE:  INP   LOCATION:  1338                         FACILITY:  [facility]   PHYSICIAN:  Angelia Mould. Derrell Lolling, M.D.DATE OF BIRTH:  [birth year]   DATE OF ADMISSION:  01/05/2007  DATE OF DISCHARGE:  01/11/2007                               DISCHARGE SUMMARY   FINAL DIAGNOSES:  1. Nausea and vomiting, etiology unclear, resolved.  2. Chronic idiopathic abdominal pain.  3. Status post laparoscopy and laparoscopic appendectomy.   PROCEDURE PERFORMED:  None.   HISTORY:  This is a 25 year old white female who has been evaluated by  Dr. Carman Ching for several months for chronic right lower quadrant  abdominal pain.  Dr. Randa Evens asked me to consider diagnostic laparoscopy  on the patient, and that was decided upon electively.  She recently  underwent diagnostic laparoscopy and laparoscopic appendectomy.  There  were essentially negative findings and pathologically the appendix was  normal.  She was discharged from the hospital.  She returned to  emergency room, complaining of persistent nausea and vomiting since  discharge, unable to keep even fluids down, although she was continuing  to have bowel movements, denied fever or chills.   PHYSICAL EXAMINATION:  GENERAL:  A young woman who is thin, appeared  mildly ill.  VITAL SIGNS:  Temp. 98.1, respirations 22, heart rate 81, blood pressure  147/108.  NECK:  No mass.  LUNGS:  Clear to auscultation.  ABDOMEN:  Diminished bowel sounds.  Soft, not distended, with moderate  right lower quadrant tenderness.   ADMISSION DATA:  CBC, complete metabolic panel, and abdominal x-rays  were normal.   HOSPITAL COURSE:  The patient was evaluated by Dr. Glenna Fellows and  was admitted.  I assumed her care on the following day, May 9.  She  complained of coughing, with white sputum production and substernal  chest pain, but the vomiting resolved,  and she did not have any stools  overnight.  It was unclear as to whether there was any serious problem.  The patient was difficult to evaluate.  CT scan of the chest showed no  evidence of a pulmonary embolism, minimal atelectasis.  CT scan of the  abdomen and pelvis was basically unremarkable, showing only postop  changes.   Over the next couple of days, things settled down slowly.  She said she  was having diarrhea.  Stool studies were sent.  Stool for C. difficile  was negative.  A stool culture for routine pathogens was negative.  C.  diff. toxin was negative times three or four times.   The patient was seen in consultation by Dr. Bernette Redbird on Jan 09, 2007.  He felt that this was chronic idiopathic abdominal pain.  It was  his opinion that there was no discrete or specific pathologic process to  account for her pain, and that this could be managed as an outpatient,  possibly with probiotic therapy or Pain Clinic consult.  He also  mentioned a psychiatric consult to screen the patient for depressive  and/or somatoform disorders.  We advanced her diet after this, and after  a couple of days, she decided to go home.  She actually agreed to go  home on May 13, but later in the day changed her mind and wanted to stay  in the hospital, just to be sure that she was okay.   She did agree for discharge on Jan 11, 2007.  At that time she was  tolerating a diet without vomiting and wanted to go home.  Her abdomen  was soft and benign.  I did not think there was any surgical problem.  She was asked to return to see me in my office in three weeks.  She was  to follow up with Dr. Randa Evens regarding her chronic symptoms.      Angelia Mould. Derrell Lolling, M.D.  Electronically Signed     HMI/MEDQ  D:  01/30/2007  T:  01/30/2007  Job:  [id number]   cc:   Fayrene Fearing L. Malon Kindle., M.D.  Fax: [phone]   Bernette Redbird, M.D.  Fax: [phone]   Fleet Contras, M.D.  Fax: [phone]

## 2011-05-20 LAB — URINALYSIS, ROUTINE W REFLEX MICROSCOPIC
Glucose, UA: NEGATIVE
Hgb urine dipstick: NEGATIVE
Protein, ur: NEGATIVE
Specific Gravity, Urine: 1.041 — ABNORMAL HIGH
pH: 6

## 2011-05-20 LAB — COMPREHENSIVE METABOLIC PANEL WITH GFR
ALT: 8
Calcium: 8.5
Creatinine, Ser: 0.72
GFR calc Af Amer: >60
Glucose, Bld: 95
Sodium: 136
Total Protein: 6.1

## 2011-05-20 LAB — CBC
HCT: 33.4 — ABNORMAL LOW
Hemoglobin: 11.5 — ABNORMAL LOW
MCHC: 34.4
MCV: 94.9
Platelets: 309
RBC: 3.52 — ABNORMAL LOW
RDW: 12.5
WBC: 6.5

## 2011-05-20 LAB — COMPREHENSIVE METABOLIC PANEL
AST: 13
Albumin: 3.6
Alkaline Phosphatase: 35 — ABNORMAL LOW
BUN: 7
CO2: 24
Chloride: 105
GFR calc non Af Amer: >60
Potassium: 3.4 — ABNORMAL LOW
Total Bilirubin: 0.6

## 2011-05-20 LAB — DIFFERENTIAL
Basophils Absolute: 0
Basophils Relative: 0
Eosinophils Absolute: 0.2
Eosinophils Relative: 3
Lymphocytes Relative: 34
Lymphs Abs: 2.2
Monocytes Absolute: 0.5
Monocytes Relative: 7
Neutro Abs: 3.6
Neutrophils Relative %: 55

## 2011-05-20 LAB — LIPASE, BLOOD: Lipase: 13

## 2011-06-11 LAB — I-STAT 8, (EC8 V) (CONVERTED LAB)
BUN: 6
Bicarbonate: 24.3 — ABNORMAL HIGH
Chloride: 107
HCT: 38
Hemoglobin: 12.9
Operator id: 270651
Sodium: 141

## 2011-06-11 LAB — DIFFERENTIAL
Basophils Absolute: 0
Lymphocytes Relative: 32
Monocytes Absolute: 0.7
Monocytes Relative: 8
Neutro Abs: 4.9

## 2011-06-11 LAB — CBC
HCT: 35.7 — ABNORMAL LOW
Hemoglobin: 12.3
MCHC: 34.5
MCV: 94.9
Platelets: 337
RBC: 3.76 — ABNORMAL LOW
RDW: 13.6
WBC: 8.7

## 2011-06-11 LAB — URINALYSIS, ROUTINE W REFLEX MICROSCOPIC
Bilirubin Urine: NEGATIVE
Glucose, UA: NEGATIVE
Hgb urine dipstick: NEGATIVE
Ketones, ur: NEGATIVE
Nitrite: NEGATIVE
Protein, ur: NEGATIVE
Specific Gravity, Urine: 1.006
Urobilinogen, UA: 0.2
pH: 6.5

## 2011-06-11 LAB — GC/CHLAMYDIA PROBE AMP, GENITAL
Chlamydia, DNA Probe: NEGATIVE
GC Probe Amp, Genital: NEGATIVE

## 2011-06-11 LAB — URINE MICROSCOPIC-ADD ON

## 2011-06-11 LAB — POCT PREGNANCY, URINE
Operator id: 27065
Preg Test, Ur: NEGATIVE

## 2011-06-15 LAB — URINALYSIS, ROUTINE W REFLEX MICROSCOPIC
Glucose, UA: NEGATIVE
Hgb urine dipstick: NEGATIVE
Specific Gravity, Urine: 1.024
pH: 6

## 2011-06-15 LAB — POCT PREGNANCY, URINE
Operator id: 151321
Preg Test, Ur: NEGATIVE

## 2011-07-14 ENCOUNTER — Other Ambulatory Visit: Payer: Self-pay | Admitting: Obstetrics

## 2011-10-17 ENCOUNTER — Encounter (HOSPITAL_COMMUNITY): Payer: Self-pay

## 2011-10-17 ENCOUNTER — Emergency Department (HOSPITAL_COMMUNITY)
Admission: EM | Admit: 2011-10-17 | Discharge: 2011-10-17 | Disposition: A | Discharge status: Home / Self Care | Payer: Medicaid Other | Attending: Emergency Medicine | Admitting: Emergency Medicine

## 2011-10-17 ENCOUNTER — Emergency Department (HOSPITAL_COMMUNITY): Payer: Medicaid Other

## 2011-10-17 DIAGNOSIS — S9000XA Contusion of unspecified ankle, initial encounter: Secondary | ICD-10-CM | POA: Insufficient documentation

## 2011-10-17 DIAGNOSIS — S93409A Sprain of unspecified ligament of unspecified ankle, initial encounter: Secondary | ICD-10-CM | POA: Insufficient documentation

## 2011-10-17 DIAGNOSIS — W108XXA Fall (on) (from) other stairs and steps, initial encounter: Secondary | ICD-10-CM | POA: Insufficient documentation

## 2011-10-17 DIAGNOSIS — M25579 Pain in unspecified ankle and joints of unspecified foot: Secondary | ICD-10-CM | POA: Insufficient documentation

## 2011-10-17 DIAGNOSIS — M25473 Effusion, unspecified ankle: Secondary | ICD-10-CM | POA: Insufficient documentation

## 2011-10-17 DIAGNOSIS — M25476 Effusion, unspecified foot: Secondary | ICD-10-CM | POA: Insufficient documentation

## 2011-10-17 DIAGNOSIS — S93402A Sprain of unspecified ligament of left ankle, initial encounter: Secondary | ICD-10-CM

## 2011-10-17 MED ORDER — OXYCODONE-ACETAMINOPHEN 5-325 MG PO TABS: 1.0000 | ORAL_TABLET | ORAL | Status: AC | PRN

## 2011-10-17 MED ORDER — IBUPROFEN 600 MG PO TABS: 600.0000 mg | ORAL_TABLET | Freq: Three times a day (TID) | ORAL | Status: AC | PRN

## 2011-10-17 NOTE — Progress Notes (Signed)
Orthopedic Tech Progress Note Patient Details:  Carmen Noble 19-Nov-1985 [id number]  Other Ortho Devices Type of Ortho Device: Crutches;Ace wrap Ortho Device Location: left ankle Ortho Device Interventions: Application Viewed order for ace wrap from rn order list  Nikki Dom 10/17/2011, 1:35 PM

## 2011-10-17 NOTE — ED Provider Notes (Signed)
History     CSN: [id number]  Arrival date & time 10/17/11  1226   First MD Initiated Contact with Patient 10/17/11 1243      Chief Complaint  Patient presents with  . Ankle Pain    (Consider location/radiation/quality/duration/timing/severity/associated sxs/prior treatment) Patient is a 26 y.o. female presenting with ankle pain. The history is provided by the patient.  Ankle Pain    patient reports falling last night down 2 stairs and developing pain in her left ankle.  She reports awakening this morning with pain in her left ankle as well as swelling and bruising.  She thinks she inverted her left ankle.  Her pain is worsened by walking movement and palpation.  Her pain is improved by nothing.  Her pain is moderate at this time.  She drove to the ER with her kids and needs to do at home.  She denies neck pain.  She had no head injury.  She denies any other complaints  History reviewed. No pertinent past medical history.  History reviewed. No pertinent past surgical history.  No family history on file.  History  Substance Use Topics  . Smoking status: Current Everyday Smoker  . Smokeless tobacco: Not on file  . Alcohol Use: Yes     occasional    OB History    Grav Para Term Preterm Abortions TAB SAB Ect Mult Living                  Review of Systems  All other systems reviewed and are negative.    Allergies  Penicillins  Home Medications   Current Outpatient Rx  Name Route Sig Dispense Refill  . BUTALBITAL-APAP-CAFFEINE 50-325-40 MG PO TABS Oral Take 1 tablet by mouth every 6 (six) hours as needed. For migraines      BP 123/81  Pulse 89  Temp(Src) 98.4 F (36.9 C) (Oral)  Resp 20  Ht 5\' 3"  (1.6 m)  Wt 137 lb (62.143 kg)  BMI 24.27 kg/m2  SpO2 98%  Physical Exam  Nursing note and vitals reviewed. Constitutional: She is oriented to person, place, and time. She appears well-developed and well-nourished.  HENT:  Head: Normocephalic.  Eyes: EOM are  normal.  Neck: Normal range of motion.  Pulmonary/Chest: Effort normal.  Musculoskeletal: Normal range of motion.       Tenderness swelling and ecchymosis of the left lateral malleolus.  She is neurovascularly intact distally.  She has pain with range of motion.  Her compartments are soft.  Neurological: She is alert and oriented to person, place, and time.  Psychiatric: She has a normal mood and affect.    ED Course  Procedures (including critical care time)  Labs Reviewed - No data to display Dg Ankle Complete Left  10/17/2011  *RADIOLOGY REPORT*  Clinical Data: Larey Seat.  Soft tissue swelling laterally  LEFT ANKLE COMPLETE - 3+ VIEW  Comparison: None  Findings: Ankle is located.  The mortise is intact.  There is soft tissue swelling adjacent to the lateral malleolus.  No acute fracture or focal bony abnormalities identified peri  IMPRESSION: Lateral soft tissue swelling.  No acute bony abnormality identified.  Original Report Authenticated By: Britta Mccreedy, M.D.    I personally reviewed the x-ray  1. Sprain of left ankle       MDM  Her ankle films are without evidence of acute fracture.  The patient be discharged home with crutches and an Ace bandage as well as pain medications.  Lyanne Co, MD 10/17/11 [phone]

## 2011-10-17 NOTE — ED Notes (Signed)
Pt. Fell last night rt. Ankle is swollen and ecchymotic

## 2011-12-13 ENCOUNTER — Encounter (HOSPITAL_COMMUNITY): Payer: Self-pay | Admitting: Physical Medicine and Rehabilitation

## 2011-12-13 ENCOUNTER — Emergency Department (HOSPITAL_COMMUNITY)
Admission: EM | Admit: 2011-12-13 | Discharge: 2011-12-13 | Disposition: A | Discharge status: Home / Self Care | Payer: Medicaid Other | Attending: Emergency Medicine | Admitting: Emergency Medicine

## 2011-12-13 DIAGNOSIS — M25579 Pain in unspecified ankle and joints of unspecified foot: Secondary | ICD-10-CM | POA: Insufficient documentation

## 2011-12-13 DIAGNOSIS — F17200 Nicotine dependence, unspecified, uncomplicated: Secondary | ICD-10-CM | POA: Insufficient documentation

## 2011-12-13 DIAGNOSIS — Z880 Allergy status to penicillin: Secondary | ICD-10-CM | POA: Insufficient documentation

## 2011-12-13 MED ORDER — OXYCODONE-ACETAMINOPHEN 5-325 MG PO TABS: 1.0000 | ORAL_TABLET | ORAL | Status: AC | PRN

## 2011-12-13 NOTE — Discharge Instructions (Signed)
Ankle Pain  Ankle pain is a common symptom. The bones, cartilage, tendons, and muscles of the ankle joint perform a lot of work each day. The ankle joint holds your body weight and allows you to move around. Ankle pain can occur on either side or back of 1 or both ankles. Ankle pain may be sharp and burning or dull and aching. There may be tenderness, stiffness, redness, or warmth around the ankle. The pain occurs more often when a person walks or puts pressure on the ankle.  CAUSES   There are many reasons ankle pain can develop. It is important to work with your caregiver to identify the cause since many conditions can impact the bones, cartilage, muscles, and tendons. Causes for ankle pain include:  · Injury, including a break (fracture), sprain, or strain often due to a fall, sports, or a high-impact activity.  · Swelling (inflammation) of a tendon (tendonitis).  · Achilles tendon rupture.  · Ankle instability after repeated sprains and strains.  · Poor foot alignment.  · Pressure on a nerve (tarsal tunnel syndrome).  · Arthritis in the ankle or the lining of the ankle.  · Crystal formation in the ankle (gout or pseudogout).  DIAGNOSIS   A diagnosis is based on your medical history, your symptoms, results of your physical exam, and results of diagnostic tests. Diagnostic tests may include X-ray exams or a computerized magnetic scan (magnetic resonance imaging, MRI).  TREATMENT   Treatment will depend on the cause of your ankle pain and may include:  · Keeping pressure off the ankle and limiting activities.  · Using crutches or other walking support (a cane or brace).  · Using rest, ice, compression, and elevation.  · Participating in physical therapy or home exercises.  · Wearing shoe inserts or special shoes.  · Losing weight.  · Taking medications to reduce pain or swelling or receiving an injection.  · Undergoing surgery.  HOME CARE INSTRUCTIONS   · Only take over-the-counter or prescription medicines for  pain, discomfort, or fever as directed by your caregiver.  · Put ice on the injured area.  · Put ice in a plastic bag.  · Place a towel between your skin and the bag.  · Leave the ice on for 15 to 20 minutes at a time, 3 to 4 times a day.  · Keep your leg raised (elevated) when possible to lessen swelling.  · Avoid activities that cause ankle pain.  · Follow specific exercises as directed by your caregiver.  · Record how often you have ankle pain, the location of the pain, and what it feels like. This information may be helpful to you and your caregiver.  · Ask your caregiver about returning to work or sports and whether you should drive.  · Follow up with your caregiver for further examination, therapy, or testing as directed.  SEEK MEDICAL CARE IF:   · Pain or swelling continues or worsens beyond 1 week.  · You have an oral temperature above 102° F (38.9° C).  · You are feeling unwell or have chills.  · You are having an increasingly difficult time with walking.  · You have loss of sensation or other new symptoms.  · You have questions or concerns.  MAKE SURE YOU:   · Understand these instructions.  · Will watch your condition.  · Will get help right away if you are not doing well or get worse.  Document Released: 02/03/2010 Document Revised: 08/05/2011 Document   Reviewed: 02/03/2010  [employer]® Patient Information ©2012 [employer].

## 2011-12-13 NOTE — ED Notes (Signed)
Pt presents to department for evaluation of L ankle pain and numbness to toes. Pt states she sustained fall in February and was referred to sports medicine. Now states pain is worse to L ankle, also states numbness and tingling to all toes on L foot. Able to wiggle digits. No recent injury. Pedal pulses present. 6/10 pain upon arrival.

## 2011-12-13 NOTE — ED Notes (Signed)
Mild swelling to left ankle. Patient reports she fell several months ago. No recent injury reported.

## 2011-12-13 NOTE — ED Provider Notes (Signed)
History     CSN: [id number]  Arrival date & time 12/13/11  1044   First MD Initiated Contact with Patient 12/13/11 1144      Chief Complaint  Patient presents with  . Ankle Pain     Patient is a 26 y.o. female presenting with ankle pain. The history is provided by the patient.  Ankle Pain  The incident occurred more than 1 week ago. The injury mechanism was a fall. The pain is present in the left ankle. The quality of the pain is described as sharp. The pain is moderate. The pain has been constant since onset. Associated symptoms include numbness. Pertinent negatives include no loss of motion, no muscle weakness and no loss of sensation. The symptoms are aggravated by activity and palpation. She has tried immobilization for the symptoms. The treatment provided no relief.  Pt reports she injured her left ankle two months ago She was seen by orthopedist as follow up, placed in walking boot but pain is persisting On her most recent followup, she was referred to physical therapy.  No surgery required.  She continues to wear walking boot.  She reports continued pain in left ankle and numbness to left 5th toe.  No new injury.  No swelling to leg.  No cp/sob.  No fever.  No calf tenderness. No h/o DVT  PMH  - none  History reviewed. No pertinent past surgical history.  No family history on file.  History  Substance Use Topics  . Smoking status: Current Everyday Smoker    Types: Cigarettes  . Smokeless tobacco: Not on file  . Alcohol Use: Yes     occasional    OB History    Grav Para Term Preterm Abortions TAB SAB Ect Mult Living                  Review of Systems  Constitutional: Negative for fever.  Neurological: Positive for numbness.    Allergies  Penicillins  Home Medications   Current Outpatient Rx  Name Route Sig Dispense Refill  . BUTALBITAL-APAP-CAFFEINE 50-325-40 MG PO TABS Oral Take 1 tablet by mouth every 6 (six) hours as needed. For migraines    .  OXYCODONE-ACETAMINOPHEN 5-325 MG PO TABS Oral Take 1 tablet by mouth every 4 (four) hours as needed for pain. 5 tablet 0    BP 127/81  Pulse 82  Temp 98.2 F (36.8 C)  Resp 18  SpO2 99%  Physical Exam CONSTITUTIONAL: Well developed/well nourished HEAD AND FACE: Normocephalic/atraumatic EYES: EOMI/PERRL ENMT: Mucous membranes moist NECK: supple no meningeal signs CV: S1/S2 noted, no murmurs/rubs/gallops noted LUNGS: Lungs are clear to auscultation bilaterally, no apparent distress ABDOMEN: soft, nontender, no rebound or guarding GU:no cva tenderness NEURO: Pt is awake/alert, moves all extremitiesx4.  Reports numbness to left 5th toe but no focal motor deficit noted in left foot/toes.   EXTREMITIES: pulses normal, full ROM.  Tenderness to left lateral malleolus.  No edema/warmth/induration to left ankle/foot.  No calf tenderness.  No erythema noted.  Distal cap refill less than 3 seconds in bilateral toes.  Distal pulses equal/intact on each foot.  No significant discoloration noted to either foot.  No left knee tenderness noted.   SKIN: warm, color normal PSYCH: no abnormalities of mood noted  ED Course  Procedures     1. Ankle pain    I instructed patient to stop wearing walking boot.  Use crutches for next week.  Short course of pain meds.  The patient appears reasonably screened and/or stabilized for discharge and I doubt any other medical condition or other [facility] requiring further screening, evaluation, or treatment in the ED at this time prior to discharge.    MDM  Nursing notes reviewed and considered in documentation         Joya Gaskins, MD 12/13/11 1226

## 2012-02-01 ENCOUNTER — Other Ambulatory Visit: Payer: Self-pay | Admitting: Obstetrics & Gynecology

## 2012-02-01 DIAGNOSIS — N644 Mastodynia: Secondary | ICD-10-CM

## 2012-02-01 DIAGNOSIS — N6452 Nipple discharge: Secondary | ICD-10-CM

## 2012-02-07 ENCOUNTER — Ambulatory Visit
Admission: RE | Admit: 2012-02-07 | Discharge: 2012-02-07 | Disposition: A | Discharge status: Home / Self Care | Payer: Medicaid Other | Source: Ambulatory Visit | Attending: Obstetrics & Gynecology | Admitting: Obstetrics & Gynecology

## 2012-02-07 DIAGNOSIS — N644 Mastodynia: Secondary | ICD-10-CM

## 2012-02-07 DIAGNOSIS — N6452 Nipple discharge: Secondary | ICD-10-CM

## 2012-02-17 ENCOUNTER — Emergency Department (HOSPITAL_COMMUNITY): Payer: Medicaid Other

## 2012-02-17 ENCOUNTER — Emergency Department (HOSPITAL_COMMUNITY)
Admission: EM | Admit: 2012-02-17 | Discharge: 2012-02-17 | Disposition: A | Discharge status: Home / Self Care | Payer: Medicaid Other | Attending: Emergency Medicine | Admitting: Emergency Medicine

## 2012-02-17 ENCOUNTER — Encounter (HOSPITAL_COMMUNITY): Payer: Self-pay | Admitting: *Deleted

## 2012-02-17 DIAGNOSIS — F17200 Nicotine dependence, unspecified, uncomplicated: Secondary | ICD-10-CM | POA: Insufficient documentation

## 2012-02-17 DIAGNOSIS — X58XXXA Exposure to other specified factors, initial encounter: Secondary | ICD-10-CM | POA: Insufficient documentation

## 2012-02-17 DIAGNOSIS — S43006A Unspecified dislocation of unspecified shoulder joint, initial encounter: Secondary | ICD-10-CM | POA: Insufficient documentation

## 2012-02-17 DIAGNOSIS — I10 Essential (primary) hypertension: Secondary | ICD-10-CM | POA: Insufficient documentation

## 2012-02-17 DIAGNOSIS — M24219 Disorder of ligament, unspecified shoulder: Secondary | ICD-10-CM

## 2012-02-17 DIAGNOSIS — Z880 Allergy status to penicillin: Secondary | ICD-10-CM | POA: Insufficient documentation

## 2012-02-17 HISTORY — DX: Migraine, unspecified, not intractable, without status migrainosus: G43.909

## 2012-02-17 HISTORY — DX: Essential (primary) hypertension: I10

## 2012-02-17 MED ORDER — HYDROCODONE-ACETAMINOPHEN 5-325 MG PO TABS: 1.0000 | ORAL_TABLET | ORAL | Status: AC | PRN

## 2012-02-17 NOTE — ED Notes (Signed)
Ortho paged for Left shoulder immobilizer placement

## 2012-02-17 NOTE — ED Notes (Signed)
Pt states that 3 days ago she started having left shoulder pain. Pt states that she has undiagnosed rotator cuff problems. Pt states no injury, pt denies lifting anything heavy. Pt equal grips.

## 2012-02-17 NOTE — Progress Notes (Signed)
Orthopedic Tech Progress Note Patient Details:  Carmen Noble 09/09/1985 [id number]  Ortho Devices Type of Ortho Device: Sling immobilizer Ortho Device/Splint Location: (L) UE Ortho Device/Splint Interventions: Application   Jennye Moccasin 02/17/2012, 3:47 PM

## 2012-02-17 NOTE — Discharge Instructions (Signed)
It is very important that you continue to have your condition managed by an orthopedist.  Please call as soon as possible to insure appropriate ongoing care.  Please do not drive or operate any heavy machinery while using the pain medication that was prescribed for you.

## 2012-02-17 NOTE — ED Provider Notes (Signed)
History  This chart was scribed for Gerhard Munch, MD by Bennett Scrape. This patient was seen in room TR04C/TR04C and the patient's care was started at 1:20PM.  CSN: [id number]  Arrival date & time 02/17/12  1249   First MD Initiated Contact with Patient 02/17/12 1320      Chief Complaint  Patient presents with  . Shoulder Pain    The history is provided by the patient. No language interpreter was used.    Carmen Noble is a 26 y.o. female who presents to the Emergency Department complaining of 3 days of sudden onset, gradually worsening, constant left shoulder pain. The pain is worse with movement. She denies taking OTC medications at home to improve symptoms. She states that she had one prior episode of similar left shoulder pain that occurred last year. She states that she had a negative radiology report and was discharged home with pain medications. She denies any recent falls, heavy lifting or trauma. She denies fever, chills and emesis as associated symptoms. She has a h/o HTN and migraines. She is a current everyday smoker and occasional alcohol user.   Past Medical History  Diagnosis Date  . Hypertension   . Migraine     Past Surgical History  Procedure Date  . Hernia repair   . Appendectomy   . Ankle surgery     History reviewed. No pertinent family history.  History  Substance Use Topics  . Smoking status: Current Everyday Smoker    Types: Cigarettes  . Smokeless tobacco: Not on file  . Alcohol Use: Yes     occasional    Review of Systems  Constitutional: Negative for fever and chills.  Gastrointestinal: Negative for vomiting.  Musculoskeletal:       Left shoulder pain    Allergies  Penicillins  Home Medications   Current Outpatient Rx  Name Route Sig Dispense Refill  . BUTALBITAL-APAP-CAFFEINE 50-325-40 MG PO TABS Oral Take 1 tablet by mouth every 6 (six) hours as needed. For migraines      Triage Vitals: BP 106/75  Pulse 96  Temp 99.2  F (37.3 C) (Oral)  Resp 16  SpO2 98%  Physical Exam  Nursing note and vitals reviewed. Constitutional: She is oriented to person, place, and time. She appears well-developed and well-nourished. No distress.  HENT:  Head: Normocephalic and atraumatic.  Eyes: Conjunctivae and EOM are normal.  Cardiovascular: Normal rate.   Pulmonary/Chest: Effort normal. No stridor. No respiratory distress.  Abdominal: She exhibits no distension.  Musculoskeletal: She exhibits no edema.       Asymmetry of the left shoulder, limited ROM due to pain   Neurological: She is alert and oriented to person, place, and time. No cranial nerve deficit.  Skin: Skin is warm and dry.  Psychiatric: She has a normal mood and affect.    ED Course  Reduction of dislocation Date/Time: 02/17/2012 3:30 PM Performed by: Gerhard Munch Authorized by: Gerhard Munch Consent: Verbal consent obtained. The procedure was performed in an emergent situation. Risks and benefits: risks, benefits and alternatives were discussed Consent given by: patient Time out: Immediately prior to procedure a "time out" was called to verify the correct patient, procedure, equipment, support staff and site/side marked as required. Patient sedated: no Patient tolerance: Patient tolerated the procedure well with no immediate complications. Comments: The patient's left shoulder was reduced with manual traction, with a discernible change in its orientation, and a more rounded off appearance of the left shoulder.   (  including critical care time)  COORDINATION OF CARE: 1:57PM-Discussed treatment plan with pt and pt agreed to plan.   Labs Reviewed - No data to display Dg Shoulder Left  02/17/2012  *RADIOLOGY REPORT*  Clinical Data: Left shoulder pain and erythema  LEFT SHOULDER - 2+ VIEW  Comparison: November 05, 2009  Findings: The [facility] joint is intact and the subacromial space is maintained.  There is no evidence of fracture or dislocation.  No  abnormal soft tissue gas is present.  IMPRESSION: Normal.  Original Report Authenticated By: Brandon Melnick, M.D.     No diagnosis found.   A repeat evaluation of the patient notes that her shoulder is painful again, with recurrence of the deformity.  She notes that this occurs with some frequency. MDM  I personally performed the services described in this documentation, which was scribed in my presence. The recorded information has been reviewed and considered.  This generally well-appearing female now presents with ongoing left shoulder pain.  The patient does endorse recurrent pain in the left shoulder, though she is unclear on the etiology.  On initial arrival the patient has a squared off appearance of the left shoulder and an inability to touch her contralateral shoulder, suggestive of an inferior dislocation.  Following manual reduction the patient's shoulder return to a more normal appearance, with a return to normal capacity.  However, after this position was maintained for some time, the patient's deformity recurred.  Throughout this entire process the patient was in no distress, though she was uncomfortable.  Given this recurrence of phenomena, be it subluxation, chronic laxity, she was discharged in a sling and to follow up with orthopedics for additional evaluation and management.  Gerhard Munch, MD 02/17/12 [phone]

## 2012-02-17 NOTE — ED Notes (Signed)
Discharged home with written and verbal instructions.  No questions at discharge. Understands follow up

## 2012-03-13 ENCOUNTER — Ambulatory Visit: Payer: Medicaid Other | Attending: Orthopedic Surgery | Admitting: Rehabilitative and Restorative Service Providers"

## 2012-03-13 DIAGNOSIS — M25519 Pain in unspecified shoulder: Secondary | ICD-10-CM | POA: Insufficient documentation

## 2012-03-13 DIAGNOSIS — IMO0001 Reserved for inherently not codable concepts without codable children: Secondary | ICD-10-CM | POA: Insufficient documentation

## 2012-03-20 ENCOUNTER — Ambulatory Visit: Payer: Medicaid Other

## 2012-03-27 ENCOUNTER — Encounter: Payer: Medicaid Other | Admitting: Rehabilitative and Restorative Service Providers"

## 2012-04-05 ENCOUNTER — Encounter (HOSPITAL_COMMUNITY): Payer: Self-pay | Admitting: Emergency Medicine

## 2012-04-05 ENCOUNTER — Emergency Department (HOSPITAL_COMMUNITY): Payer: Medicaid Other

## 2012-04-05 ENCOUNTER — Emergency Department (HOSPITAL_COMMUNITY)
Admission: EM | Admit: 2012-04-05 | Discharge: 2012-04-05 | Disposition: A | Discharge status: Home / Self Care | Payer: Medicaid Other | Attending: Emergency Medicine | Admitting: Emergency Medicine

## 2012-04-05 DIAGNOSIS — Y92009 Unspecified place in unspecified non-institutional (private) residence as the place of occurrence of the external cause: Secondary | ICD-10-CM | POA: Insufficient documentation

## 2012-04-05 DIAGNOSIS — Z23 Encounter for immunization: Secondary | ICD-10-CM | POA: Insufficient documentation

## 2012-04-05 DIAGNOSIS — S022XXA Fracture of nasal bones, initial encounter for closed fracture: Secondary | ICD-10-CM | POA: Insufficient documentation

## 2012-04-05 DIAGNOSIS — F17200 Nicotine dependence, unspecified, uncomplicated: Secondary | ICD-10-CM | POA: Insufficient documentation

## 2012-04-05 DIAGNOSIS — Z9089 Acquired absence of other organs: Secondary | ICD-10-CM | POA: Insufficient documentation

## 2012-04-05 DIAGNOSIS — IMO0002 Reserved for concepts with insufficient information to code with codable children: Secondary | ICD-10-CM | POA: Insufficient documentation

## 2012-04-05 DIAGNOSIS — R51 Headache: Secondary | ICD-10-CM | POA: Insufficient documentation

## 2012-04-05 DIAGNOSIS — I10 Essential (primary) hypertension: Secondary | ICD-10-CM | POA: Insufficient documentation

## 2012-04-05 DIAGNOSIS — H9209 Otalgia, unspecified ear: Secondary | ICD-10-CM | POA: Insufficient documentation

## 2012-04-05 MED ORDER — TETANUS-DIPHTH-ACELL PERTUSSIS 5-2.5-18.5 LF-MCG/0.5 IM SUSP
0.5000 mL | Freq: Once | INTRAMUSCULAR | Status: AC
  Administered 2012-04-05: 0.5 mL via INTRAMUSCULAR
  Filled 2012-04-05: qty 0.5

## 2012-04-05 MED ORDER — OXYCODONE-ACETAMINOPHEN 5-325 MG PO TABS
1.0000 | ORAL_TABLET | Freq: Once | ORAL | Status: AC
  Administered 2012-04-05: 1 via ORAL
  Filled 2012-04-05: qty 1

## 2012-04-05 MED ORDER — OXYCODONE-ACETAMINOPHEN 5-325 MG PO TABS: 1.0000 | ORAL_TABLET | ORAL | Status: AC | PRN
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
## 2012-06-30 ENCOUNTER — Emergency Department: Payer: Self-pay | Admitting: Emergency Medicine

## 2012-07-06 ENCOUNTER — Emergency Department (HOSPITAL_COMMUNITY)
Admission: EM | Admit: 2012-07-06 | Discharge: 2012-07-06 | Disposition: A | Discharge status: Home / Self Care | Payer: Medicaid Other | Attending: Emergency Medicine | Admitting: Emergency Medicine

## 2012-07-06 ENCOUNTER — Encounter (HOSPITAL_COMMUNITY): Payer: Self-pay | Admitting: Emergency Medicine

## 2012-07-06 ENCOUNTER — Emergency Department (HOSPITAL_COMMUNITY): Payer: Medicaid Other

## 2012-07-06 DIAGNOSIS — Z79899 Other long term (current) drug therapy: Secondary | ICD-10-CM | POA: Insufficient documentation

## 2012-07-06 DIAGNOSIS — G43909 Migraine, unspecified, not intractable, without status migrainosus: Secondary | ICD-10-CM | POA: Insufficient documentation

## 2012-07-06 DIAGNOSIS — I10 Essential (primary) hypertension: Secondary | ICD-10-CM | POA: Insufficient documentation

## 2012-07-06 DIAGNOSIS — S93409A Sprain of unspecified ligament of unspecified ankle, initial encounter: Secondary | ICD-10-CM | POA: Insufficient documentation

## 2012-07-06 DIAGNOSIS — F17200 Nicotine dependence, unspecified, uncomplicated: Secondary | ICD-10-CM | POA: Insufficient documentation

## 2012-07-06 DIAGNOSIS — Y9389 Activity, other specified: Secondary | ICD-10-CM | POA: Insufficient documentation

## 2012-07-06 DIAGNOSIS — W108XXA Fall (on) (from) other stairs and steps, initial encounter: Secondary | ICD-10-CM | POA: Insufficient documentation

## 2012-07-06 DIAGNOSIS — Y929 Unspecified place or not applicable: Secondary | ICD-10-CM | POA: Insufficient documentation

## 2012-07-06 MED ORDER — OXYCODONE-ACETAMINOPHEN 5-325 MG PO TABS: 1.0000 | ORAL_TABLET | ORAL | Status: DC | PRN

## 2012-07-06 NOTE — Progress Notes (Signed)
Orthopedic Tech Progress Note Patient Details:  Carmen Noble [birth year] [id number]  Ortho Devices Type of Ortho Device: Crutches Ortho Device/Splint Interventions: Application   Jennye Moccasin 07/06/2012, 6:30 PM

## 2012-07-06 NOTE — ED Provider Notes (Signed)
History     CSN: [id number]  Arrival date & time 07/06/12  1604   First MD Initiated Contact with Patient 07/06/12 1740      Chief Complaint  Patient presents with  . Ankle Pain    (Consider location/radiation/quality/duration/timing/severity/associated sxs/prior treatment) HPI  Patient states she fell down steps at home today and is having right ankle pain with swelling since.  She is bearing weight but is uncomfortable.  She has swelling but no laceration and denies other injury.  She has had prior injury to this ankle.   Past Medical History  Diagnosis Date  . Hypertension   . Migraine     Past Surgical History  Procedure Date  . Hernia repair   . Appendectomy   . Ankle surgery     History reviewed. No pertinent family history.  History  Substance Use Topics  . Smoking status: Current Every Day Smoker    Types: Cigarettes  . Smokeless tobacco: Not on file  . Alcohol Use: Yes     Comment: occasional    OB History    Grav Para Term Preterm Abortions TAB SAB Ect Mult Living                  Review of Systems  Constitutional: Negative for fever, chills, activity change, appetite change and unexpected weight change.  HENT: Negative for sore throat, rhinorrhea, neck pain, neck stiffness and sinus pressure.   Eyes: Negative for visual disturbance.  Respiratory: Negative for cough and shortness of breath.   Cardiovascular: Negative for chest pain and leg swelling.  Gastrointestinal: Negative for vomiting, abdominal pain, diarrhea and blood in stool.  Genitourinary: Negative for dysuria, urgency, frequency, vaginal discharge and difficulty urinating.  Musculoskeletal: Positive for joint swelling. Negative for myalgias, arthralgias and gait problem.  Skin: Negative for color change and rash.  Neurological: Negative for weakness, light-headedness and headaches.  Hematological: Does not bruise/bleed easily.  Psychiatric/Behavioral: Negative for dysphoric mood.     Allergies  Bee venom and Penicillins  Home Medications   Current Outpatient Rx  Name  Route  Sig  Dispense  Refill  . ALBUTEROL SULFATE HFA 108 (90 BASE) MCG/ACT IN AERS   Inhalation   Inhale 2 puffs into the lungs every 6 (six) hours as needed. For shortness of breath or wheezing         . BUTALBITAL-APAP-CAFFEINE 50-325-40 MG PO TABS   Oral   Take 2 tablets by mouth every 12 (twelve) hours as needed. For migraines         . DIPHENHYDRAMINE-APAP (SLEEP) 25-500 MG PO TABS   Oral   Take 2 tablets by mouth at bedtime as needed. For pain         . TRAMADOL HCL 50 MG PO TABS   Oral   Take 100 mg by mouth 2 (two) times daily as needed. For pain         . OXYCODONE-ACETAMINOPHEN 5-325 MG PO TABS   Oral   Take 1 tablet by mouth every 4 (four) hours as needed for pain.   6 tablet   0     BP 106/59  Pulse 100  Temp 98.4 F (36.9 C) (Oral)  Resp 18  SpO2 97%  Physical Exam  Nursing note and vitals reviewed. Constitutional: She appears well-developed and well-nourished.  HENT:  Head: Normocephalic and atraumatic.  Neck: Normal range of motion.  Musculoskeletal: She exhibits tenderness. She exhibits no edema.       Right  ankle with lateral swelling and mild diffuse tenderness.  Knee joint nontender and intact.  dp intact and toes with good cap refill and sensation.   Neurological: She is alert.  Skin: Skin is warm and dry.  Psychiatric: She has a normal mood and affect.    ED Course  Procedures (including critical care time)  Labs Reviewed - No data to display Dg Ankle Complete Right  07/06/2012  *RADIOLOGY REPORT*  Clinical Data: Ankle pain  RIGHT ANKLE - COMPLETE 3+ VIEW  Comparison: None  Findings: There is no evidence of fracture or dislocation.  There is no evidence of arthropathy or other focal bone abnormality. Soft tissues are unremarkable.  IMPRESSION: Negative exam.   Original Report Authenticated By: Signa Kell, M.D.      1. Ankle sprain        Plan ace, boot, crutches, follow up with ortho        Hilario Quarry, MD 07/06/12 1944

## 2012-07-06 NOTE — ED Notes (Signed)
Pt refuses d/c at this time, states she needs a prescription for pain medication

## 2012-07-06 NOTE — ED Notes (Signed)
Pt sts fell down two stairs at home today and having right ankle pain; pt is wearing boot from home for comfort

## 2013-01-08 ENCOUNTER — Ambulatory Visit: Payer: Self-pay | Admitting: Obstetrics

## 2013-02-19 ENCOUNTER — Encounter (HOSPITAL_COMMUNITY): Payer: Self-pay | Admitting: Emergency Medicine

## 2013-02-19 ENCOUNTER — Emergency Department (HOSPITAL_COMMUNITY)
Admission: EM | Admit: 2013-02-19 | Discharge: 2013-02-19 | Disposition: A | Discharge status: Home / Self Care | Payer: Medicaid Other | Attending: Emergency Medicine | Admitting: Emergency Medicine

## 2013-02-19 DIAGNOSIS — L02415 Cutaneous abscess of right lower limb: Secondary | ICD-10-CM

## 2013-02-19 DIAGNOSIS — Y929 Unspecified place or not applicable: Secondary | ICD-10-CM | POA: Insufficient documentation

## 2013-02-19 DIAGNOSIS — Z880 Allergy status to penicillin: Secondary | ICD-10-CM | POA: Insufficient documentation

## 2013-02-19 DIAGNOSIS — Z79899 Other long term (current) drug therapy: Secondary | ICD-10-CM | POA: Insufficient documentation

## 2013-02-19 DIAGNOSIS — Y939 Activity, unspecified: Secondary | ICD-10-CM | POA: Insufficient documentation

## 2013-02-19 DIAGNOSIS — G43909 Migraine, unspecified, not intractable, without status migrainosus: Secondary | ICD-10-CM | POA: Insufficient documentation

## 2013-02-19 DIAGNOSIS — L089 Local infection of the skin and subcutaneous tissue, unspecified: Secondary | ICD-10-CM | POA: Insufficient documentation

## 2013-02-19 DIAGNOSIS — W57XXXA Bitten or stung by nonvenomous insect and other nonvenomous arthropods, initial encounter: Secondary | ICD-10-CM | POA: Insufficient documentation

## 2013-02-19 DIAGNOSIS — L02419 Cutaneous abscess of limb, unspecified: Secondary | ICD-10-CM | POA: Insufficient documentation

## 2013-02-19 DIAGNOSIS — F17200 Nicotine dependence, unspecified, uncomplicated: Secondary | ICD-10-CM | POA: Insufficient documentation

## 2013-02-19 DIAGNOSIS — I10 Essential (primary) hypertension: Secondary | ICD-10-CM | POA: Insufficient documentation

## 2013-02-19 MED ORDER — HYDROCODONE-ACETAMINOPHEN 5-325 MG PO TABS: 1.0000 | ORAL_TABLET | ORAL | Status: DC | PRN

## 2013-02-19 MED ORDER — SULFAMETHOXAZOLE-TRIMETHOPRIM 800-160 MG PO TABS: 1.0000 | ORAL_TABLET | Freq: Two times a day (BID) | ORAL | Status: DC

## 2013-02-19 NOTE — ED Provider Notes (Signed)
History  This chart was scribed for non-physician practitioner, Dierdre Forth PA-C, working with Flint Melter, MD by Ardeen Jourdain, ED Scribe. This patient was seen in room TR11C/TR11C and the patient's care was started at 1654.  CSN: [id number] Arrival date & time 02/19/13  1414   First MD Initiated Contact with Patient 02/19/13 1654     Chief Complaint  Patient presents with  . Abscess  . Insect Bite    Patient is a 27 y.o. female presenting with abscess. The history is provided by the patient. No language interpreter was used.  Abscess Location:  Foot Foot abscess location:  R ankle Abscess quality: painful and redness   Abscess quality: not draining   Red streaking: no   Duration:  4 days Progression:  Worsening Pain details:    Quality:  Burning   Severity:  Mild   Duration:  4 days   Timing:  Constant   Progression:  Worsening Chronicity:  New Context: insect bite/sting   Context: not diabetes   Relieved by:  Nothing Worsened by:  Draining/squeezing Ineffective treatments:  Warm compresses and warm water soaks Associated symptoms: no fever, no headaches, no nausea and no vomiting   Risk factors: no prior abscess     HPI Comments: Carmen Noble is a 27 y.o. female who presents to the Emergency Department complaining of a possible insect bite. She is c/o a gradual onset, gradually worsening constant abscess to her right ankle that began 4 days ago. She states the area has been burning and in a great deal of pain. She denies seeing anything bite her. She denies any nausea, emesis, diarrhea, fever, rash or numbness as associated symptoms. She states the pain radiates to her ankle, but she is able to walk without difficulty. She denies any drainage from the area. She had tried epsom salt to treat the wound without relief and nothing seems to make it better or worse.    Past Medical History  Diagnosis Date  . Hypertension   . Migraine    Past Surgical  History  Procedure Laterality Date  . Hernia repair    . Appendectomy    . Ankle surgery     History reviewed. No pertinent family history. History  Substance Use Topics  . Smoking status: Current Every Day Smoker    Types: Cigarettes  . Smokeless tobacco: Not on file  . Alcohol Use: Yes     Comment: occasional   No OB history available.  Review of Systems  Constitutional: Negative for fever.  Gastrointestinal: Negative for nausea and vomiting.  Skin: Positive for wound.       Red area to right leg  Neurological: Negative for headaches.  All other systems reviewed and are negative.    Allergies  Bee venom and Penicillins  Home Medications   Current Outpatient Rx  Name  Route  Sig  Dispense  Refill  . albuterol (PROVENTIL HFA;VENTOLIN HFA) 108 (90 BASE) MCG/ACT inhaler   Inhalation   Inhale 2 puffs into the lungs every 6 (six) hours as needed for wheezing or shortness of breath. For shortness of breath or wheezing         . diphenhydramine-acetaminophen (TYLENOL PM) 25-500 MG TABS   Oral   Take 2 tablets by mouth at bedtime as needed. For pain         . HYDROcodone-acetaminophen (NORCO/VICODIN) 5-325 MG per tablet   Oral   Take 1 tablet by mouth every 4 (four) hours as  needed for pain.   6 tablet   0   . sulfamethoxazole-trimethoprim (SEPTRA DS) 800-160 MG per tablet   Oral   Take 1 tablet by mouth every 12 (twelve) hours.   20 tablet   0    Triage Vitals: BP 132/79  Pulse 90  Temp(Src) 98.4 F (36.9 C) (Oral)  Resp 18  SpO2 95%   Physical Exam  Nursing note and vitals reviewed. Constitutional: She is oriented to person, place, and time. She appears well-developed and well-nourished. No distress.  HENT:  Head: Normocephalic and atraumatic.  Eyes: Conjunctivae are normal. No scleral icterus.  Neck: Normal range of motion.  Cardiovascular: Normal rate, regular rhythm, normal heart sounds and intact distal pulses.   No murmur heard. Capillary  refill < 3 sec  Pulmonary/Chest: Effort normal and breath sounds normal. No respiratory distress. She has no wheezes.  Musculoskeletal: Normal range of motion.       Right ankle: She exhibits swelling (lateral side only). She exhibits normal range of motion, no ecchymosis, no deformity and no laceration. Tenderness. Lateral malleolus tenderness found. No medial malleolus tenderness found. Achilles tendon normal. Achilles tendon exhibits no pain, no defect and normal Thompson's test results.       Feet:  Full ROM of right ankle. No pain with movement of ankle.   Lymphadenopathy:    She has no cervical adenopathy.  Neurological: She is alert and oriented to person, place, and time.  Skin: Skin is warm and dry. She is not diaphoretic. There is erythema.  4 by 4 cm area of induration to right lateral lower leg. Warm to touch. Ulcerated center with no drainage. Mild fluctuance at the center under the ulcerated tissue. Extending erythema 12 cm down to lateral malleolus. No evidence of septic joint. No erythema of the medial side of the joint.  Psychiatric: She has a normal mood and affect.    ED Course  INCISION AND DRAINAGE Date/Time: 02/19/2013 5:00 PM Performed by: Dierdre Forth Authorized by: Dierdre Forth Consent: Verbal consent obtained. Risks and benefits: risks, benefits and alternatives were discussed Consent given by: patient Patient understanding: patient states understanding of the procedure being performed Patient consent: the patient's understanding of the procedure matches consent given Procedure consent: procedure consent matches procedure scheduled Relevant documents: relevant documents present and verified Site marked: the operative site was marked Required items: required blood products, implants, devices, and special equipment available Patient identity confirmed: verbally with patient and arm band Time out: Immediately prior to procedure a "time out" was  called to verify the correct patient, procedure, equipment, support staff and site/side marked as required. Type: abscess Body area: lower extremity Location details: right leg Anesthesia: local infiltration Local anesthetic: lidocaine 2% without epinephrine and lidocaine 1% without epinephrine Anesthetic total: 3 ml Patient sedated: no Scalpel size: 11 Incision type: single straight Complexity: complex Drainage: purulent Drainage amount: moderate Wound treatment: wound left open Packing material: none Patient tolerance: Patient tolerated the procedure well with no immediate complications.   (including critical care time)  DIAGNOSTIC STUDIES: Oxygen Saturation is 95% on room air, normal by my interpretation.    COORDINATION OF CARE:  4:55 PM-Discussed treatment plan which includes I&D and instructions for home care with pt at bedside and pt agreed to plan.   Labs Reviewed - No data to display No results found. 1. Abscess of right lower leg     MDM  Verita Schneiders presents after possible insect bite with gross abscess.  Patient  with skin abscess amenable to incision and drainage.  Abscess was not large enough to warrant packing or drain,  wound recheck in 2-3 days with PCP.  Erythema extending down to the lateral malleolus of the right ankle however there is no evidence of septic joint. Encouraged home warm soaks and flushing.  Mild signs of cellulitis in surrounding skin.  Will d/c to home with MRSA antibiotic coverage as there is streaking from the cellulitic area.  I have also discussed reasons to return immediately to the ER.  Patient expresses understanding and agrees with plan.  I personally performed the services described in this documentation, which was scribed in my presence. The recorded information has been reviewed and is accurate.   Dahlia Client Jing Howatt, PA-C 02/19/13 1740

## 2013-02-19 NOTE — ED Notes (Signed)
Pt c/o red area to right leg from possible bug bite

## 2013-02-20 NOTE — ED Provider Notes (Signed)
Medical screening examination/treatment/procedure(s) were performed by non-physician practitioner and as supervising physician I was immediately available for consultation/collaboration.  Flint Melter, MD 02/20/13 [phone]

## 2013-02-22 ENCOUNTER — Emergency Department: Payer: Self-pay | Admitting: Emergency Medicine

## 2013-06-02 ENCOUNTER — Emergency Department (HOSPITAL_COMMUNITY)
Admission: EM | Admit: 2013-06-02 | Discharge: 2013-06-02 | Disposition: A | Discharge status: Home / Self Care | Payer: Medicaid Other | Attending: Emergency Medicine | Admitting: Emergency Medicine

## 2013-06-02 ENCOUNTER — Emergency Department (HOSPITAL_COMMUNITY): Payer: Medicaid Other

## 2013-06-02 ENCOUNTER — Encounter (HOSPITAL_COMMUNITY): Payer: Self-pay | Admitting: *Deleted

## 2013-06-02 DIAGNOSIS — Z880 Allergy status to penicillin: Secondary | ICD-10-CM | POA: Insufficient documentation

## 2013-06-02 DIAGNOSIS — M24412 Recurrent dislocation, left shoulder: Secondary | ICD-10-CM

## 2013-06-02 DIAGNOSIS — I10 Essential (primary) hypertension: Secondary | ICD-10-CM | POA: Insufficient documentation

## 2013-06-02 DIAGNOSIS — Z79899 Other long term (current) drug therapy: Secondary | ICD-10-CM | POA: Insufficient documentation

## 2013-06-02 DIAGNOSIS — F17200 Nicotine dependence, unspecified, uncomplicated: Secondary | ICD-10-CM | POA: Insufficient documentation

## 2013-06-02 DIAGNOSIS — G43909 Migraine, unspecified, not intractable, without status migrainosus: Secondary | ICD-10-CM | POA: Insufficient documentation

## 2013-06-02 DIAGNOSIS — M24419 Recurrent dislocation, unspecified shoulder: Secondary | ICD-10-CM | POA: Insufficient documentation

## 2013-06-02 MED ORDER — IBUPROFEN 800 MG PO TABS: 800.0000 mg | ORAL_TABLET | Freq: Three times a day (TID) | ORAL | Status: DC

## 2013-06-02 NOTE — ED Provider Notes (Signed)
CSN: [id number]     Arrival date & time 06/02/13  1211 History   First MD Initiated Contact with Patient 06/02/13 1223     Chief Complaint  Patient presents with  . Shoulder Pain   (Consider location/radiation/quality/duration/timing/severity/associated sxs/prior Treatment) Patient is a 27 y.o. female presenting with shoulder pain. The history is provided by the patient. No language interpreter was used.  Shoulder Pain This is a recurrent problem. Pertinent negatives include no chest pain, chills, fever or numbness. Associated symptoms comments: She feels her shoulder on the left has been "popping" in and out for the last several days. No direct injury. She has been seen for same in the emergency department in the past but has not followed up with orthopedics. No other complaint..    Past Medical History  Diagnosis Date  . Hypertension   . Migraine    Past Surgical History  Procedure Laterality Date  . Hernia repair    . Appendectomy    . Ankle surgery     No family history on file. History  Substance Use Topics  . Smoking status: Current Every Day Smoker    Types: Cigarettes  . Smokeless tobacco: Not on file  . Alcohol Use: Yes     Comment: occasional   OB History   Grav Para Term Preterm Abortions TAB SAB Ect Mult Living                 Review of Systems  Constitutional: Negative for fever and chills.  Respiratory: Negative.  Negative for shortness of breath.   Cardiovascular: Negative.  Negative for chest pain.  Musculoskeletal:       See HPI.  Neurological: Negative.  Negative for numbness.    Allergies  Bee venom and Penicillins  Home Medications   Current Outpatient Rx  Name  Route  Sig  Dispense  Refill  . albuterol (PROVENTIL HFA;VENTOLIN HFA) 108 (90 BASE) MCG/ACT inhaler   Inhalation   Inhale 2 puffs into the lungs every 6 (six) hours as needed for wheezing or shortness of breath. For shortness of breath or wheezing         .  diphenhydramine-acetaminophen (TYLENOL PM) 25-500 MG TABS   Oral   Take 2 tablets by mouth at bedtime as needed. For pain         . HYDROcodone-acetaminophen (NORCO/VICODIN) 5-325 MG per tablet   Oral   Take 1 tablet by mouth every 4 (four) hours as needed for pain.   6 tablet   0   . sulfamethoxazole-trimethoprim (SEPTRA DS) 800-160 MG per tablet   Oral   Take 1 tablet by mouth every 12 (twelve) hours.   20 tablet   0    BP 127/83  Pulse 108  Temp(Src) 98.5 F (36.9 C) (Oral)  Resp 18  Wt 129 lb 4 oz (58.627 kg)  BMI 22.9 kg/m2  SpO2 99%  LMP 05/29/2013 Physical Exam  Constitutional: She is oriented to person, place, and time. She appears well-developed and well-nourished.  Neck: Normal range of motion.  Cardiovascular: Intact distal pulses.   Pulmonary/Chest: Effort normal.  Musculoskeletal:  Left shoulder without swelling, discoloration or significant bony deformity. FROM. Full grip strength.   Neurological: She is alert and oriented to person, place, and time.  Skin: Skin is warm and dry.  Psychiatric: She has a normal mood and affect.    ED Course  Procedures (including critical care time) Labs Review Labs Reviewed - No data to display Imaging Review  No results found. Dg Shoulder Left  06/02/2013   CLINICAL DATA:  Left shoulder pain, question dislocation.  EXAM: LEFT SHOULDER - 2+ VIEW  COMPARISON:  01/2012 and 11/05/2009.  FINDINGS: Exam demonstrates no evidence of fracture. The the humeral head appears to the slightly anterior and inferiorly subluxed without frank dislocation.  IMPRESSION: Suggestion of mild anterior/inferior subluxation of the humeral head with respect to the glenoid. No acute fracture or frank dislocation.   Electronically Signed   By: Elberta Fortis M.D.   On: 06/02/2013 13:16   MDM  No diagnosis found. 1. Recurrent/chronic left shoulder subluxation 2. Left shoulder pain  X-rays are similar to previous images which show a chronic  subluxation, likely indicating laxity. No acute findings. Shoulder immobilizer given and Ortho referral made.     Arnoldo Hooker, PA-C 06/02/13 1331

## 2013-06-02 NOTE — ED Notes (Signed)
Shoulder immobilizer applied. Pms post application.

## 2013-06-02 NOTE — ED Notes (Signed)
Pt states that left shoulder has been popping in and out.  Pt states she has been here with this before

## 2013-06-11 NOTE — ED Provider Notes (Signed)
Medical screening examination/treatment/procedure(s) were performed by non-physician practitioner and as supervising physician I was immediately available for consultation/collaboration.  Raeford Razor, MD 06/11/13 [phone]

## 2013-06-27 ENCOUNTER — Ambulatory Visit (INDEPENDENT_AMBULATORY_CARE_PROVIDER_SITE_OTHER): Payer: Medicaid Other | Admitting: Obstetrics

## 2013-06-27 ENCOUNTER — Encounter: Payer: Self-pay | Admitting: Obstetrics

## 2013-06-27 VITALS — BP 112/77 | HR 103 | Temp 98.9°F | Ht 63.0 in | Wt 127.0 lb

## 2013-06-27 DIAGNOSIS — N946 Dysmenorrhea, unspecified: Secondary | ICD-10-CM | POA: Insufficient documentation

## 2013-06-27 DIAGNOSIS — Z0000 Encounter for general adult medical examination without abnormal findings: Secondary | ICD-10-CM

## 2013-06-27 DIAGNOSIS — N83209 Unspecified ovarian cyst, unspecified side: Secondary | ICD-10-CM | POA: Insufficient documentation

## 2013-06-27 MED ORDER — PNV PRENATAL PLUS MULTIVITAMIN 27-1 MG PO TABS: 1.0000 | ORAL_TABLET | Freq: Every day | ORAL | Status: DC

## 2013-06-27 MED ORDER — MELOXICAM 7.5 MG PO TABS: 7.5000 mg | ORAL_TABLET | Freq: Every day | ORAL | Status: DC

## 2013-06-27 NOTE — Progress Notes (Signed)
Subjective:     Carmen Noble is a 27 y.o. female here for a routine exam.  Current complaints: patient is in the office for abdominal pain and pain under ribs. Patient reports no unusual discharge.  Personal health questionnaire reviewed: yes.   Gynecologic History Patient's last menstrual period was 06/20/2013. Contraception: none Last Pap: over 1 year- 07/2011. Results were: normal   Obstetric History OB History  No data available     The following portions of the patient's history were reviewed and updated as appropriate: allergies, current medications, past family history, past medical history, past social history, past surgical history and problem list.  Review of Systems Pertinent items are noted in HPI.    Objective:    General appearance: alert and no distress Breasts: normal appearance, no masses or tenderness Abdomen: normal findings: soft, non-tender Pelvic: cervix normal in appearance, external genitalia normal, no cervical motion tenderness, positive findings: adnexal mass Right or tenderness of right adnexa(e), uterus normal size, shape, and consistency and vagina normal without discharge    Assessment:    Right Ovarian Cyst   Plan:    Education reviewed: safe sex/STD prevention, self breast exams, smoking cessation and management of smamm ovarian cysts.. Contraception: none. Follow up in: 3 months. U/S in 3 months if cyst not resolved on exam.

## 2013-06-28 ENCOUNTER — Encounter: Payer: Self-pay | Admitting: Obstetrics

## 2013-06-28 LAB — PAP IG W/ RFLX HPV ASCU

## 2013-06-28 LAB — WET PREP BY MOLECULAR PROBE: Trichomonas vaginosis: NEGATIVE

## 2013-06-28 LAB — GC/CHLAMYDIA PROBE AMP: CT Probe RNA: NEGATIVE

## 2013-06-29 ENCOUNTER — Telehealth: Payer: Self-pay | Admitting: *Deleted

## 2013-06-29 MED ORDER — FLUCONAZOLE 150 MG PO TABS: 150.0000 mg | ORAL_TABLET | Freq: Once | ORAL | Status: DC

## 2013-06-29 MED ORDER — METRONIDAZOLE 500 MG PO TABS: 500.0000 mg | ORAL_TABLET | Freq: Two times a day (BID) | ORAL | Status: DC

## 2013-06-29 NOTE — Telephone Encounter (Signed)
Message copied by Glendell Docker on Fri Jun 29, 2013  3:10 PM ------      Message from: Coral Ceo A      Created: Fri Jun 29, 2013  6:49 AM       Diflucan 150 mg po.      Flagyl 500mg  po bid x 7 days. ------

## 2013-06-29 NOTE — Telephone Encounter (Signed)
Call placed to patient no answer. A detailed voice message was left informing patient of lab results and rx to pharmacy. A voice message was left advising patient to call office if any questions.

## 2013-07-05 ENCOUNTER — Other Ambulatory Visit: Payer: Self-pay

## 2013-07-06 ENCOUNTER — Encounter (HOSPITAL_COMMUNITY): Payer: Self-pay | Admitting: Emergency Medicine

## 2013-07-06 ENCOUNTER — Emergency Department (HOSPITAL_COMMUNITY)
Admission: EM | Admit: 2013-07-06 | Discharge: 2013-07-06 | Disposition: A | Discharge status: Home / Self Care | Payer: Medicaid Other | Attending: Emergency Medicine | Admitting: Emergency Medicine

## 2013-07-06 DIAGNOSIS — L03019 Cellulitis of unspecified finger: Secondary | ICD-10-CM | POA: Insufficient documentation

## 2013-07-06 DIAGNOSIS — I10 Essential (primary) hypertension: Secondary | ICD-10-CM | POA: Insufficient documentation

## 2013-07-06 DIAGNOSIS — F17200 Nicotine dependence, unspecified, uncomplicated: Secondary | ICD-10-CM | POA: Insufficient documentation

## 2013-07-06 DIAGNOSIS — Z880 Allergy status to penicillin: Secondary | ICD-10-CM | POA: Insufficient documentation

## 2013-07-06 DIAGNOSIS — Z792 Long term (current) use of antibiotics: Secondary | ICD-10-CM | POA: Insufficient documentation

## 2013-07-06 DIAGNOSIS — L03012 Cellulitis of left finger: Secondary | ICD-10-CM

## 2013-07-06 DIAGNOSIS — Z79899 Other long term (current) drug therapy: Secondary | ICD-10-CM | POA: Insufficient documentation

## 2013-07-06 DIAGNOSIS — Z791 Long term (current) use of non-steroidal anti-inflammatories (NSAID): Secondary | ICD-10-CM | POA: Insufficient documentation

## 2013-07-06 DIAGNOSIS — L02519 Cutaneous abscess of unspecified hand: Secondary | ICD-10-CM | POA: Insufficient documentation

## 2013-07-06 MED ORDER — SULFAMETHOXAZOLE-TRIMETHOPRIM 800-160 MG PO TABS: 1.0000 | ORAL_TABLET | Freq: Two times a day (BID) | ORAL | Status: DC

## 2013-07-06 MED ORDER — SULFAMETHOXAZOLE-TMP DS 800-160 MG PO TABS
1.0000 | ORAL_TABLET | Freq: Once | ORAL | Status: AC
  Administered 2013-07-06: 1 via ORAL
  Filled 2013-07-06: qty 1

## 2013-07-06 NOTE — ED Notes (Signed)
PT comfortable with d/c and f/u instructions. Prescriptions x1 

## 2013-07-06 NOTE — ED Notes (Signed)
Left hand fourth digit swollen, warm to touch, tender on palpation, redness noted on tip of finger. Pt states swelling started 3 days ago. Painful with bending.

## 2013-07-06 NOTE — ED Provider Notes (Signed)
CSN: [id number]     Arrival date & time 07/06/13  2149 History  This chart was scribed for non-physician practitioner, Emilia Beck, PA-C,working with Donnetta Hutching, MD, by Karle Plumber, ED Scribe.  This patient was seen in room TR11C/TR11C and the patient's care was started at 11:04 PM.  Chief Complaint  Patient presents with  . Finger swelling    The history is provided by the patient. No language interpreter was used.   HPI Comments:  Carmen Noble is a 27 y.o. female who presents to the Emergency Department complaining of left fourth finger pain and swelling onset three days. Pt states she tried to manipulate the swollen area herself to produce drainage without success. Pain is throbbing and severe without radiation.   Past Medical History  Diagnosis Date  . Hypertension   . Migraine    Past Surgical History  Procedure Laterality Date  . Hernia repair    . Appendectomy    . Ankle surgery    . Mouth surgery     Family History  Problem Relation Age of Onset  . Diabetes Father    History  Substance Use Topics  . Smoking status: Current Every Day Smoker -- 0.50 packs/day    Types: Cigarettes  . Smokeless tobacco: Not on file  . Alcohol Use: No     Comment: occasional   OB History   Grav Para Term Preterm Abortions TAB SAB Ect Mult Living                 Review of Systems  Skin:       Fourth digit pain and swelling around nail bed.   All other systems reviewed and are negative.    Allergies  Bee venom and Penicillins  Home Medications   Current Outpatient Rx  Name  Route  Sig  Dispense  Refill  . albuterol (PROVENTIL HFA;VENTOLIN HFA) 108 (90 BASE) MCG/ACT inhaler   Inhalation   Inhale 2 puffs into the lungs every 6 (six) hours as needed for wheezing or shortness of breath. For shortness of breath or wheezing         . fluconazole (DIFLUCAN) 150 MG tablet   Oral   Take 1 tablet (150 mg total) by mouth once.   1 tablet   0   . ibuprofen  (ADVIL,MOTRIN) 800 MG tablet   Oral   Take 1 tablet (800 mg total) by mouth 3 (three) times daily.   21 tablet   0   . meloxicam (MOBIC) 7.5 MG tablet   Oral   Take 1 tablet (7.5 mg total) by mouth daily.   30 tablet   5   . metroNIDAZOLE (FLAGYL) 500 MG tablet   Oral   Take 1 tablet (500 mg total) by mouth 2 (two) times daily.   14 tablet   0   . Prenatal Vit-Fe Fumarate-FA (PNV PRENATAL PLUS MULTIVITAMIN) 27-1 MG TABS   Oral   Take 1 tablet by mouth daily before breakfast.   30 tablet   11    Triage Vitals: BP 140/91  Pulse 116  Temp(Src) 99 F (37.2 C) (Oral)  Resp 16  Ht 5\' 4"  (1.626 m)  Wt 127 lb (57.607 kg)  BMI 21.79 kg/m2  SpO2 99%  LMP 06/20/2013 Physical Exam  Nursing note and vitals reviewed. Constitutional: She is oriented to person, place, and time. She appears well-developed and well-nourished. No distress.  HENT:  Head: Normocephalic and atraumatic.  Eyes: Conjunctivae are normal. No  scleral icterus.  Neck: Neck supple.  Cardiovascular: Normal rate and intact distal pulses.   Pulmonary/Chest: Effort normal. No stridor. No respiratory distress.  Abdominal: Normal appearance. She exhibits no distension.  Neurological: She is alert and oriented to person, place, and time.  Skin: Skin is warm and dry. No rash noted. There is erythema.  Erythema, edema and tenderness to palpation to left fourth finger.   Psychiatric: She has a normal mood and affect. Her behavior is normal.    ED Course  Procedures (including critical care time) DIAGNOSTIC STUDIES: Oxygen Saturation is 99% on RA, normal by my interpretation.   COORDINATION OF CARE: 11:08 PM- Will prescribe antibiotic. Pt verbalizes understanding and agrees to plan.  Medications  sulfamethoxazole-trimethoprim (BACTRIM DS) 800-160 MG per tablet 1 tablet (not administered)   Labs Review Labs Reviewed - No data to display Imaging Review No results found.  EKG Interpretation   None        MDM   1. Cellulitis of finger of left hand    11:15 PM Patient will have Bactrim for cellulitis. No fluctuant area to drain. Vitals stable and patient afebrile.    I personally performed the services described in this documentation, which was scribed in my presence. The recorded information has been reviewed and is accurate.    Emilia Beck, PA-C 07/06/13 2318

## 2013-07-10 NOTE — ED Provider Notes (Signed)
Medical screening examination/treatment/procedure(s) were performed by non-physician practitioner and as supervising physician I was immediately available for consultation/collaboration.  EKG Interpretation   None        Donnetta Hutching, MD 07/10/13 1541

## 2013-07-11 ENCOUNTER — Ambulatory Visit (INDEPENDENT_AMBULATORY_CARE_PROVIDER_SITE_OTHER): Payer: Medicaid Other | Admitting: Obstetrics

## 2013-07-11 ENCOUNTER — Encounter: Payer: Self-pay | Admitting: Obstetrics

## 2013-07-11 VITALS — BP 123/84 | HR 112 | Temp 99.6°F | Ht 64.0 in | Wt 132.0 lb

## 2013-07-11 DIAGNOSIS — N946 Dysmenorrhea, unspecified: Secondary | ICD-10-CM

## 2013-07-11 DIAGNOSIS — Z7251 High risk heterosexual behavior: Secondary | ICD-10-CM

## 2013-07-11 LAB — POCT URINALYSIS DIPSTICK
Blood, UA: NEGATIVE
Glucose, UA: NEGATIVE
Spec Grav, UA: <=1.005
Urobilinogen, UA: NEGATIVE

## 2013-07-11 MED ORDER — HYDROCODONE-ACETAMINOPHEN 7.5-300 MG PO TABS: 1.0000 | ORAL_TABLET | Freq: Four times a day (QID) | ORAL | Status: DC | PRN

## 2013-07-11 NOTE — Progress Notes (Signed)
Pt in office for annual GYN exam, states she was here in October and was examined, chlamydia and pap results in computer, does not use contraceptive.

## 2013-07-11 NOTE — Progress Notes (Signed)
Subjective:     Carmen Noble is a 27 y.o. female here for a routine exam.  Current complaints: none.  Personal health questionnaire reviewed: yes.   Gynecologic History Patient's last menstrual period was 06/20/2013. Contraception: none Last Pap:05/2013. Results were: normal Last mammogram: with 1-2 years. Results were: normal  Obstetric History OB History  No data available     The following portions of the patient's history were reviewed and updated as appropriate: allergies, current medications, past family history, past medical history, past social history, past surgical history and problem list.  Review of Systems Pertinent items are noted in HPI.    Objective:    General appearance: alert and no distress Abdomen: normal findings: soft, non-tender Pelvic: cervix normal in appearance, external genitalia normal, no adnexal masses or tenderness, no cervical motion tenderness, rectovaginal septum normal, uterus normal size, shape, and consistency and vagina normal without discharge    Assessment:    Healthy female exam.  Dysmenorrhea.  H/O BV.  Resolved.   Plan:    Education reviewed: safe sex/STD prevention and self breast exams. Follow up in: 1 year. Vicodin Rx.

## 2013-07-18 ENCOUNTER — Other Ambulatory Visit: Payer: Self-pay | Admitting: Obstetrics

## 2013-07-19 ENCOUNTER — Other Ambulatory Visit: Payer: Self-pay | Admitting: Obstetrics

## 2013-07-19 ENCOUNTER — Encounter: Payer: Self-pay | Admitting: Obstetrics

## 2013-07-22 ENCOUNTER — Other Ambulatory Visit: Payer: Self-pay | Admitting: Obstetrics

## 2013-07-23 ENCOUNTER — Encounter: Payer: Self-pay | Admitting: Obstetrics

## 2013-07-23 ENCOUNTER — Other Ambulatory Visit: Payer: Self-pay | Admitting: Obstetrics

## 2013-07-25 ENCOUNTER — Other Ambulatory Visit: Payer: Self-pay | Admitting: Obstetrics

## 2013-08-01 ENCOUNTER — Other Ambulatory Visit: Payer: Self-pay | Admitting: Obstetrics

## 2013-08-06 ENCOUNTER — Encounter (HOSPITAL_COMMUNITY): Payer: Self-pay | Admitting: Emergency Medicine

## 2013-08-06 ENCOUNTER — Emergency Department (HOSPITAL_COMMUNITY)
Admission: EM | Admit: 2013-08-06 | Discharge: 2013-08-06 | Disposition: A | Discharge status: Home / Self Care | Payer: Medicaid Other | Attending: Emergency Medicine | Admitting: Emergency Medicine

## 2013-08-06 DIAGNOSIS — IMO0001 Reserved for inherently not codable concepts without codable children: Secondary | ICD-10-CM

## 2013-08-06 DIAGNOSIS — L03019 Cellulitis of unspecified finger: Secondary | ICD-10-CM | POA: Insufficient documentation

## 2013-08-06 DIAGNOSIS — F17200 Nicotine dependence, unspecified, uncomplicated: Secondary | ICD-10-CM | POA: Insufficient documentation

## 2013-08-06 DIAGNOSIS — Z9089 Acquired absence of other organs: Secondary | ICD-10-CM | POA: Insufficient documentation

## 2013-08-06 DIAGNOSIS — Z880 Allergy status to penicillin: Secondary | ICD-10-CM | POA: Insufficient documentation

## 2013-08-06 DIAGNOSIS — I10 Essential (primary) hypertension: Secondary | ICD-10-CM | POA: Insufficient documentation

## 2013-08-06 MED ORDER — HYDROCODONE-ACETAMINOPHEN 5-325 MG PO TABS: 1.0000 | ORAL_TABLET | ORAL | Status: DC | PRN

## 2013-08-06 MED ORDER — CLINDAMYCIN HCL 150 MG PO CAPS: 150.0000 mg | ORAL_CAPSULE | Freq: Four times a day (QID) | ORAL | Status: DC

## 2013-08-06 NOTE — ED Provider Notes (Signed)
CSN: [id number]     Arrival date & time 08/06/13  1214 History   First MD Initiated Contact with Patient 08/06/13 1245    This chart was scribed for Fayrene Helper PA-C, a non-physician practitioner working with Darlys Gales, MD by Lewanda Rife, ED Scribe. This patient was seen in room TR07C/TR07C and the patient's care was started at 12:56 PM      Chief Complaint  Patient presents with  . Hand Pain   (Consider location/radiation/quality/duration/timing/severity/associated sxs/prior Treatment) The history is provided by the patient. No language interpreter was used.   HPI Comments: Carmen Noble is a 27 y.o. female who presents to the Emergency Department complaining of recurrent paronychias of distal left 4th digit onset 3 days. Reports associated mild constant pain to affected finger. Denies any aggravating or alleviating factors. Denies associated fever, chills, drainage from affected finger, nail biting, and injury to finger. Reports trying warm compresses with mild relief of symptoms. Reports tetanus status is up to date.   Past Medical History  Diagnosis Date  . Hypertension   . Migraine    Past Surgical History  Procedure Laterality Date  . Hernia repair    . Appendectomy    . Ankle surgery    . Mouth surgery     Family History  Problem Relation Age of Onset  . Diabetes Father    History  Substance Use Topics  . Smoking status: Current Every Day Smoker -- 1.00 packs/day    Types: Cigarettes  . Smokeless tobacco: Never Used     Comment: thinking about it  . Alcohol Use: No     Comment: occasional   OB History   Grav Para Term Preterm Abortions TAB SAB Ect Mult Living                 Review of Systems  Constitutional: Negative for fever and chills.  Skin:       Paronychia   Psychiatric/Behavioral: Negative for confusion.    Allergies  Bee venom and Penicillins  Home Medications   Current Outpatient Rx  Name  Route  Sig  Dispense  Refill  .  acetaminophen (TYLENOL) 500 MG tablet   Oral   Take 1,000 mg by mouth every 6 (six) hours as needed.          BP 124/77  Pulse 96  Temp(Src) 98.1 F (36.7 C) (Oral)  Resp 20  Wt 132 lb (59.875 kg)  SpO2 98%  LMP 07/17/2013 Physical Exam  Nursing note and vitals reviewed. Constitutional: She is oriented to person, place, and time. She appears well-developed and well-nourished. No distress.  HENT:  Head: Normocephalic and atraumatic.  Eyes: EOM are normal.  Neck: Neck supple. No tracheal deviation present.  Cardiovascular: Normal rate.   Pulmonary/Chest: Effort normal. No respiratory distress.  Musculoskeletal: Normal range of motion. She exhibits tenderness (L hand: 4th finger with area of induration fluctuance to lateral corner of nail bed consistent with paronychia.  ttp.  no joint involvement.  ).  Neurological: She is alert and oriented to person, place, and time.  Skin: Skin is warm and dry.  Psychiatric: She has a normal mood and affect. Her behavior is normal.    ED Course  Procedures (including critical care time) 1:30 PM  INCISION AND DRAINAGE Performed by: Fayrene Helper PA-C Consent: Verbal consent obtained. Risks and benefits: risks, benefits and alternatives were discussed Time out performed prior to procedure Type: abscess Body area: distal left 4th digit  Anesthesia: digital  block Incision was made with a scalpel. Local anesthetic: lidocaine 2% w/o epinephrine Anesthetic total: 4 ml Complexity: complex Blunt dissection to break up loculations Drainage: purulent Drainage amount: small Packing material: none Patient tolerance: Patient tolerated the procedure well with no immediate complications.    Labs Review Labs Reviewed - No data to display Imaging Review No results found.  EKG Interpretation   None       MDM   1. Paronychia of fourth finger of left hand    BP 124/77  Pulse 96  Temp(Src) 98.1 F (36.7 C) (Oral)  Resp 20  Wt 132 lb  (59.875 kg)  SpO2 98%  LMP 07/17/2013   I personally performed the services described in this documentation, which was scribed in my presence. The recorded information has been reviewed and is accurate.     Fayrene Helper, PA-C 08/06/13 1401

## 2013-08-06 NOTE — ED Notes (Signed)
Pt is here with infected left ring finger around nail

## 2013-08-07 NOTE — ED Provider Notes (Signed)
Medical screening examination/treatment/procedure(s) were performed by non-physician practitioner and as supervising physician I was immediately available for consultation/collaboration.  EKG Interpretation   None        Darlys Gales, MD 08/07/13 2205

## 2013-08-09 ENCOUNTER — Encounter: Payer: Self-pay | Admitting: Obstetrics

## 2013-08-09 ENCOUNTER — Ambulatory Visit (INDEPENDENT_AMBULATORY_CARE_PROVIDER_SITE_OTHER): Payer: Medicaid Other | Admitting: Obstetrics

## 2013-08-09 ENCOUNTER — Ambulatory Visit: Payer: Medicaid Other | Admitting: Obstetrics

## 2013-08-09 VITALS — BP 123/82 | HR 94 | Temp 99.0°F | Ht 63.0 in | Wt 133.0 lb

## 2013-08-09 DIAGNOSIS — R350 Frequency of micturition: Secondary | ICD-10-CM

## 2013-08-09 DIAGNOSIS — N946 Dysmenorrhea, unspecified: Secondary | ICD-10-CM

## 2013-08-09 LAB — POCT URINALYSIS DIPSTICK
Bilirubin, UA: NEGATIVE
Ketones, UA: NEGATIVE
pH, UA: 5

## 2013-08-09 MED ORDER — IBUPROFEN 800 MG PO TABS: 800.0000 mg | ORAL_TABLET | Freq: Three times a day (TID) | ORAL | Status: DC | PRN

## 2013-08-09 NOTE — Progress Notes (Signed)
Subjective:     Carmen Noble is a 27 y.o. female here for a problem exam.  Current complaints:increased urinary frequency, burning upon urination, flank pain, also states having same pain as when she was dx with cyst .  Personal health questionnaire reviewed: yes.   Gynecologic History Patient's last menstrual period was 07/17/2013. Contraception: none Last Pap: 06/27/2013. Results were: normal Last mammogram: 2012 results were: normal  Obstetric History OB History  No data available     The following portions of the patient's history were reviewed and updated as appropriate: allergies, current medications, past family history, past medical history, past social history, past surgical history and problem list.  Review of Systems Pertinent items are noted in HPI.    Objective:    General appearance: alert and no distress Abdomen: normal findings: soft, non-tender Pelvic: cervix normal in appearance, external genitalia normal, no adnexal masses or tenderness, no cervical motion tenderness, rectovaginal septum normal, uterus normal size, shape, and consistency and vagina normal without discharge    Assessment:    Healthy female exam.   Dysmenorrhea   Plan:    Education reviewed: safe sex/STD prevention and management of dysmenorrhea. Follow up in: several months. Ibuprofen Rx

## 2014-02-21 ENCOUNTER — Other Ambulatory Visit (INDEPENDENT_AMBULATORY_CARE_PROVIDER_SITE_OTHER): Payer: Medicaid Other

## 2014-02-21 VITALS — BP 123/86 | HR 90 | Temp 99.2°F | Ht 64.0 in | Wt 135.0 lb

## 2014-02-21 DIAGNOSIS — Z3200 Encounter for pregnancy test, result unknown: Secondary | ICD-10-CM

## 2014-02-21 DIAGNOSIS — N390 Urinary tract infection, site not specified: Secondary | ICD-10-CM

## 2014-02-21 LAB — POCT URINALYSIS DIPSTICK
BILIRUBIN UA: NEGATIVE
Glucose, UA: NEGATIVE
KETONES UA: NEGATIVE
Leukocytes, UA: NEGATIVE
Nitrite, UA: POSITIVE
PH UA: 6
RBC UA: NEGATIVE
SPEC GRAV UA: 1.015
Urobilinogen, UA: NEGATIVE

## 2014-02-21 LAB — POCT URINE PREGNANCY: Preg Test, Ur: POSITIVE

## 2014-02-21 MED ORDER — NITROFURANTOIN MONOHYD MACRO 100 MG PO CAPS: 100.0000 mg | ORAL_CAPSULE | Freq: Two times a day (BID) | ORAL | Status: DC

## 2014-02-21 NOTE — Progress Notes (Signed)
Patient in office today for a confirmation of pregnancy. Patient had 2 positive home pregnancy test. Patient intends on continuing the pregnancy.  Pregnancy test in office is positive.  Patient given samples of prenatal vitamins.  Scheduled a NOB.  Patient also had positive Nitrites in her urine. Per nursing protocol prescription sent to the pharmacy.

## 2014-02-23 LAB — URINE CULTURE: Colony Count: >=100000

## 2014-03-09 ENCOUNTER — Inpatient Hospital Stay (HOSPITAL_COMMUNITY)
Admission: AD | Admit: 2014-03-09 | Discharge: 2014-03-09 | Disposition: A | Discharge status: Home / Self Care | Payer: Medicaid Other | Source: Ambulatory Visit | Attending: Obstetrics & Gynecology | Admitting: Obstetrics & Gynecology

## 2014-03-09 ENCOUNTER — Encounter (HOSPITAL_COMMUNITY): Payer: Self-pay | Admitting: General Practice

## 2014-03-09 ENCOUNTER — Inpatient Hospital Stay (HOSPITAL_COMMUNITY): Payer: Medicaid Other

## 2014-03-09 DIAGNOSIS — O209 Hemorrhage in early pregnancy, unspecified: Secondary | ICD-10-CM | POA: Insufficient documentation

## 2014-03-09 DIAGNOSIS — R109 Unspecified abdominal pain: Secondary | ICD-10-CM | POA: Insufficient documentation

## 2014-03-09 DIAGNOSIS — O99330 Smoking (tobacco) complicating pregnancy, unspecified trimester: Secondary | ICD-10-CM | POA: Diagnosis not present

## 2014-03-09 DIAGNOSIS — Z833 Family history of diabetes mellitus: Secondary | ICD-10-CM | POA: Diagnosis not present

## 2014-03-09 DIAGNOSIS — O4690 Antepartum hemorrhage, unspecified, unspecified trimester: Secondary | ICD-10-CM

## 2014-03-09 LAB — URINE MICROSCOPIC-ADD ON

## 2014-03-09 LAB — URINALYSIS, ROUTINE W REFLEX MICROSCOPIC
GLUCOSE, UA: NEGATIVE mg/dL
KETONES UR: 15 mg/dL — AB
NITRITE: NEGATIVE
PH: 6 (ref 5.0–8.0)
Protein, ur: 30 mg/dL — AB
SPECIFIC GRAVITY, URINE: 1.025 (ref 1.005–1.030)
Urobilinogen, UA: 0.2 mg/dL (ref 0.0–1.0)

## 2014-03-09 LAB — CBC WITH DIFFERENTIAL/PLATELET
BASOS PCT: 0 % (ref 0–1)
Basophils Absolute: 0 10*3/uL (ref 0.0–0.1)
EOS ABS: 0.2 10*3/uL (ref 0.0–0.7)
EOS PCT: 3 % (ref 0–5)
HCT: 34.1 % — ABNORMAL LOW (ref 36.0–46.0)
Hemoglobin: 11.3 g/dL — ABNORMAL LOW (ref 12.0–15.0)
LYMPHS ABS: 2 10*3/uL (ref 0.7–4.0)
Lymphocytes Relative: 25 % (ref 12–46)
MCH: 34.9 pg — AB (ref 26.0–34.0)
MCHC: 33.1 g/dL (ref 30.0–36.0)
MCV: 105.2 fL — AB (ref 78.0–100.0)
MONOS PCT: 8 % (ref 3–12)
Monocytes Absolute: 0.7 10*3/uL (ref 0.1–1.0)
Neutro Abs: 5.3 10*3/uL (ref 1.7–7.7)
Neutrophils Relative %: 64 % (ref 43–77)
PLATELETS: 415 10*3/uL — AB (ref 150–400)
RBC: 3.24 MIL/uL — AB (ref 3.87–5.11)
RDW: 12 % (ref 11.5–15.5)
WBC: 8.2 10*3/uL (ref 4.0–10.5)

## 2014-03-09 LAB — WET PREP, GENITAL
CLUE CELLS WET PREP: NONE SEEN
Trich, Wet Prep: NONE SEEN
Yeast Wet Prep HPF POC: NONE SEEN

## 2014-03-09 LAB — ABO/RH: ABO/RH(D): O POS

## 2014-03-09 LAB — HCG, QUANTITATIVE, PREGNANCY: hCG, Beta Chain, Quant, S: 16242 m[IU]/mL — ABNORMAL HIGH (ref <5)

## 2014-03-09 NOTE — Discharge Instructions (Signed)
Pelvic Rest °Pelvic rest is sometimes recommended for women when:  °· The placenta is partially or completely covering the opening of the cervix (placenta previa). °· There is bleeding between the uterine wall and the amniotic sac in the first trimester (subchorionic hemorrhage). °· The cervix begins to open without labor starting (incompetent cervix, cervical insufficiency). °· The labor is too early (preterm labor). °HOME CARE INSTRUCTIONS °· Do not have sexual intercourse, stimulation, or an orgasm. °· Do not use tampons, douche, or put anything in the vagina. °· Do not lift anything over 10 pounds (4.5 kg). °· Avoid strenuous activity or straining your pelvic muscles. °SEEK MEDICAL CARE IF:  °· You have any vaginal bleeding during pregnancy. Treat this as a potential emergency. °· You have cramping pain felt low in the stomach (stronger than menstrual cramps). °· You notice vaginal discharge (watery, mucus, or bloody). °· You have a low, dull backache. °· There are regular contractions or uterine tightening. °SEEK IMMEDIATE MEDICAL CARE IF: °You have vaginal bleeding and have placenta previa.  °Document Released: 12/11/2010 Document Revised: 11/08/2011 Document Reviewed: 12/11/2010 °[employer]® Patient Information ©2015 [employer]. This information is not intended to replace advice given to you by your health care provider. Make sure you discuss any questions you have with your health care provider. °Vaginal Bleeding During Pregnancy, First Trimester °A small amount of bleeding (spotting) from the vagina is relatively common in early pregnancy. It usually stops on its own. Various things may cause bleeding or spotting in early pregnancy. Some bleeding may be related to the pregnancy, and some may not. In most cases, the bleeding is normal and is not a problem. However, bleeding can also be a sign of something serious. Be sure to tell your health care provider about any vaginal bleeding right away. °Some  possible causes of vaginal bleeding during the first trimester include: °· Infection or inflammation of the cervix. °· Growths (polyps) on the cervix. °· Miscarriage or threatened miscarriage. °· Pregnancy tissue has developed outside of the uterus and in a fallopian tube (tubal pregnancy). °· Tiny cysts have developed in the uterus instead of pregnancy tissue (molar pregnancy). °HOME CARE INSTRUCTIONS  °Watch your condition for any changes. The following actions may help to lessen any discomfort you are feeling: °· Follow your health care provider's instructions for limiting your activity. If your health care provider orders bed rest, you may need to stay in bed and only get up to use the bathroom. However, your health care provider may allow you to continue light activity. °· If needed, make plans for someone to help with your regular activities and responsibilities while you are on bed rest. °· Keep track of the number of pads you use each day, how often you change pads, and how soaked (saturated) they are. Write this down. °· Do not use tampons. Do not douche. °· Do not have sexual intercourse or orgasms until approved by your health care provider. °· If you pass any tissue from your vagina, save the tissue so you can show it to your health care provider. °· Only take over-the-counter or prescription medicines as directed by your health care provider. °· Do not take aspirin because it can make you bleed. °· Keep all follow-up appointments as directed by your health care provider. °SEEK MEDICAL CARE IF: °· You have any vaginal bleeding during any part of your pregnancy. °· You have cramps or labor pains. °· You have a fever, not controlled by medicine. °SEEK IMMEDIATE MEDICAL   CARE IF:  °· You have severe cramps in your back or belly (abdomen). °· You pass large clots or tissue from your vagina. °· Your bleeding increases. °· You feel light-headed or weak, or you have fainting episodes. °· You have chills. °· You  are leaking fluid or have a gush of fluid from your vagina. °· You pass out while having a bowel movement. °MAKE SURE YOU: °· Understand these instructions. °· Will watch your condition. °· Will get help right away if you are not doing well or get worse. °Document Released: 05/26/2005 Document Revised: 08/21/2013 Document Reviewed: 04/23/2013 °ExitCare® Patient Information ©2015 [employer]. This information is not intended to replace advice given to you by your health care provider. Make sure you discuss any questions you have with your health care provider. ° °

## 2014-03-09 NOTE — MAU Provider Note (Signed)
History     CSN: [id number]  Arrival date and time: 03/09/14 1418   First Provider Initiated Contact with Patient 03/09/14 1502       Chief Complaint  Patient presents with  . Vaginal Bleeding  . Abdominal Pain  . Back Pain   HPI  Carmen Noble is a 28 y.o. [id number] at [redacted]w[redacted]d by LMP who presents with vaginal bleeding & abdominal pain.   Vaginal bleeding since this morning. Brown spotting in underwear when she woke up; went to bathroom & looked like amount of a period but still brown. Went to bathroom in MAU, blood on toilet paper was brown & red, no clots.  RLQ pain x 2 days. Constant dull pain, occasionally sharp. Rates 5/10. No alleviating factor. Stretching & moving makes pain worse.  Denies urinary complaints, vaginal discharge, vomiting. Some nausea. Last BM yesterday, no straining. Last intercourse was 3 days ago.   Past Medical History  Diagnosis Date  . Hypertension   . Migraine     Past Surgical History  Procedure Laterality Date  . Hernia repair    . Appendectomy    . Ankle surgery    . Mouth surgery      Family History  Problem Relation Age of Onset  . Diabetes Father     History  Substance Use Topics  . Smoking status: Current Every Day Smoker -- 1.00 packs/day    Types: Cigarettes  . Smokeless tobacco: Never Used     Comment: thinking about it  . Alcohol Use: No    Allergies:  Allergies  Allergen Reactions  . Bee Venom Anaphylaxis  . Penicillins Anaphylaxis    Prescriptions prior to admission  Medication Sig Dispense Refill  . acetaminophen (TYLENOL) 325 MG tablet Take 650 mg by mouth every 6 (six) hours as needed for headache.      . Prenatal Vit-Fe Fumarate-FA (PRENATAL MULTIVITAMIN) TABS tablet Take 1 tablet by mouth daily at 12 noon.      . nitrofurantoin, macrocrystal-monohydrate, (MACROBID) 100 MG capsule Take 1 capsule (100 mg total) by mouth 2 (two) times daily. 1 po BID x 7days  14 capsule  0    Review of Systems   Constitutional: Negative.   Gastrointestinal: Positive for nausea and abdominal pain. Negative for vomiting, diarrhea, constipation and blood in stool.  Genitourinary: Negative for dysuria, urgency, frequency and hematuria.       Positive for vaginal bleeding.    Physical Exam   Blood pressure 143/59, pulse 78, temperature 98.7 F (37.1 C), temperature source Oral, resp. rate 18, height 5' 4.17" (1.63 m), weight 136 lb 12.8 oz (62.052 kg), last menstrual period 01/24/2014.  Physical Exam  Constitutional: She is oriented to person, place, and time. She appears well-developed and well-nourished.  HENT:  Head: Normocephalic.  Cardiovascular: Normal rate.   Respiratory: Effort normal. No respiratory distress.  GI: Soft. Bowel sounds are normal. She exhibits no distension and no mass. There is tenderness in the right lower quadrant and suprapubic area. There is no rigidity, no rebound, no guarding and no tenderness at McBurney's point.  Genitourinary: Uterus normal. Cervix exhibits no motion tenderness, no discharge and no friability. Right adnexum displays tenderness. Right adnexum displays no mass. Left adnexum displays no mass and no tenderness. There is bleeding around the vagina.  Small amount of brown blood in vaginal vault. Cervix closed.   Neurological: She is alert and oriented to person, place, and time.  Skin: Skin is warm and dry. No  erythema.  Psychiatric: She has a normal mood and affect.    MAU Course  Procedures Results for orders placed during the hospital encounter of 03/09/14 (from the past 24 hour(s))  URINALYSIS, ROUTINE W REFLEX MICROSCOPIC     Status: Abnormal   Collection Time    03/09/14  2:30 PM      Result Value Ref Range   Color, Urine YELLOW  YELLOW   APPearance CLOUDY (*) CLEAR   Specific Gravity, Urine 1.025  1.005 - 1.030   pH 6.0  5.0 - 8.0   Glucose, UA NEGATIVE  NEGATIVE mg/dL   Hgb urine dipstick LARGE (*) NEGATIVE   Bilirubin Urine SMALL (*)  NEGATIVE   Ketones, ur 15 (*) NEGATIVE mg/dL   Protein, ur 30 (*) NEGATIVE mg/dL   Urobilinogen, UA 0.2  0.0 - 1.0 mg/dL   Nitrite NEGATIVE  NEGATIVE   Leukocytes, UA TRACE (*) NEGATIVE  URINE MICROSCOPIC-ADD ON     Status: Abnormal   Collection Time    03/09/14  2:30 PM      Result Value Ref Range   Squamous Epithelial / LPF MANY (*) RARE   WBC, UA 3-6  <3 WBC/hpf   RBC / HPF 3-6  <3 RBC/hpf   Bacteria, UA FEW (*) RARE   Urine-Other MUCOUS PRESENT    CBC WITH DIFFERENTIAL     Status: Abnormal   Collection Time    03/09/14  2:58 PM      Result Value Ref Range   WBC 8.2  4.0 - 10.5 K/uL   RBC 3.24 (*) 3.87 - 5.11 MIL/uL   Hemoglobin 11.3 (*) 12.0 - 15.0 g/dL   HCT 16.1 (*) 09.6 - 04.5 %   MCV 105.2 (*) 78.0 - 100.0 fL   MCH 34.9 (*) 26.0 - 34.0 pg   MCHC 33.1  30.0 - 36.0 g/dL   RDW 40.9  81.1 - 91.4 %   Platelets 415 (*) 150 - 400 K/uL   Neutrophils Relative % 64  43 - 77 %   Neutro Abs 5.3  1.7 - 7.7 K/uL   Lymphocytes Relative 25  12 - 46 %   Lymphs Abs 2.0  0.7 - 4.0 K/uL   Monocytes Relative 8  3 - 12 %   Monocytes Absolute 0.7  0.1 - 1.0 K/uL   Eosinophils Relative 3  0 - 5 %   Eosinophils Absolute 0.2  0.0 - 0.7 K/uL   Basophils Relative 0  0 - 1 %   Basophils Absolute 0.0  0.0 - 0.1 K/uL  ABO/RH     Status: None   Collection Time    03/09/14  2:58 PM      Result Value Ref Range   ABO/RH(D) O POS    HCG, QUANTITATIVE, PREGNANCY     Status: Abnormal   Collection Time    03/09/14  2:58 PM      Result Value Ref Range   hCG, Beta Chain, Quant, S [id number] (*) <5 mIU/mL  WET PREP, GENITAL     Status: Abnormal   Collection Time    03/09/14  3:20 PM      Result Value Ref Range   Yeast Wet Prep HPF POC NONE SEEN  NONE SEEN   Trich, Wet Prep NONE SEEN  NONE SEEN   Clue Cells Wet Prep HPF POC NONE SEEN  NONE SEEN   WBC, Wet Prep HPF POC FEW (*) NONE SEEN   US Ob Comp Less 14 Wks  03/09/2014   CLINICAL DATA:  Pregnant patient with spotting and pain.  EXAM:  TRANSVAGINAL OB ULTRASOUND; OBSTETRIC <14 WK ULTRASOUND  TECHNIQUE: Transvaginal ultrasound was performed for complete evaluation of the gestation as well as the maternal uterus, adnexal regions, and pelvic cul-de-sac.  COMPARISON:  None.  FINDINGS: Intrauterine gestational sac: Visualized/normal in shape.  Yolk sac:  Visualized.  Embryo:  Visualized.  Cardiac Activity: Detected.  Heart Rate: 77 bpm  CRL:   0.23  mm   5 w 6 d                  US EDC: 11/03/2014  Maternal uterus/adnexae: Unremarkable.  IMPRESSION: Single living intrauterine pregnancy as described. No acute abnormality.   Electronically Signed   By: Drusilla Kannerhomas  Dalessio M.D.   On: 03/09/2014 16:17   [place] Ob Transvaginal  03/09/2014   CLINICAL DATA:  Pregnant patient with spotting and pain.  EXAM: TRANSVAGINAL OB ULTRASOUND; OBSTETRIC <14 WK ULTRASOUND  TECHNIQUE: Transvaginal ultrasound was performed for complete evaluation of the gestation as well as the maternal uterus, adnexal regions, and pelvic cul-de-sac.  COMPARISON:  None.  FINDINGS: Intrauterine gestational sac: Visualized/normal in shape.  Yolk sac:  Visualized.  Embryo:  Visualized.  Cardiac Activity: Detected.  Heart Rate: 77 bpm  CRL:   0.23  mm   5 w 6 d                  US EDC: 11/03/2014  Maternal uterus/adnexae: Unremarkable.  IMPRESSION: Single living intrauterine pregnancy as described. No acute abnormality.   Electronically Signed   By: Drusilla Kannerhomas  Dalessio M.D.   On: 03/09/2014 16:17     MDM IUP per ultrasound  Assessment and Plan  A: 1. Vaginal bleeding in pregnancy, first trimester      P: Discharge home in stable condition Discussed reasons to return to MAU Call [facility] on Monday for f/u ultrasound Keep scheduled OB appt with [facility] GC/chalmydia pending  Claudie ReveringErin B Lawrence, Student-NP 03/09/2014, 4:55 PM   Patient has h/o appendectomy  I have seen and evaluated the patient with the NP/PA/Med student. I agree with the assessment and plan as written above.   Freddi StarrJulie  N Ethier, PA-C  03/09/2014 5:12 PM

## 2014-03-09 NOTE — MAU Note (Signed)
Pt presents to MAU with c/o vaginal bleeding since this morning that is brown in color like the beginning of a period. Pt also has c/o right side pain. States she has had appendectomy. Pt also has c/o lower back pain.

## 2014-03-11 LAB — GC/CHLAMYDIA PROBE AMP
CT Probe RNA: POSITIVE — AB
GC PROBE AMP APTIMA: NEGATIVE

## 2014-03-12 ENCOUNTER — Telehealth: Payer: Self-pay | Admitting: *Deleted

## 2014-03-12 NOTE — Telephone Encounter (Signed)
Patient calling regarding positive Chlamydia results from her hospital visit. Patient was informed to contact the office for treatment.  Attempted to contact patient and left message for patient to contact the office.

## 2014-03-13 ENCOUNTER — Other Ambulatory Visit: Payer: Self-pay | Admitting: *Deleted

## 2014-03-13 DIAGNOSIS — O98819 Other maternal infectious and parasitic diseases complicating pregnancy, unspecified trimester: Principal | ICD-10-CM

## 2014-03-13 DIAGNOSIS — A749 Chlamydial infection, unspecified: Secondary | ICD-10-CM

## 2014-03-13 MED ORDER — AZITHROMYCIN 1 G PO PACK: 1.0000 g | PACK | Freq: Once | ORAL | Status: DC

## 2014-03-14 NOTE — Telephone Encounter (Signed)
Left message that Rx requested had been sent to her pharmacy.

## 2014-04-04 ENCOUNTER — Encounter: Payer: Self-pay | Admitting: Obstetrics

## 2014-04-04 ENCOUNTER — Ambulatory Visit (INDEPENDENT_AMBULATORY_CARE_PROVIDER_SITE_OTHER): Payer: Medicaid Other | Admitting: Obstetrics

## 2014-04-04 VITALS — BP 123/91 | HR 86 | Temp 98.8°F | Wt 137.0 lb

## 2014-04-04 DIAGNOSIS — Z3481 Encounter for supervision of other normal pregnancy, first trimester: Secondary | ICD-10-CM

## 2014-04-04 DIAGNOSIS — A7489 Other chlamydial diseases: Secondary | ICD-10-CM

## 2014-04-04 DIAGNOSIS — Z3480 Encounter for supervision of other normal pregnancy, unspecified trimester: Secondary | ICD-10-CM

## 2014-04-04 DIAGNOSIS — A5609 Other chlamydial infection of lower genitourinary tract: Secondary | ICD-10-CM | POA: Insufficient documentation

## 2014-04-04 MED ORDER — AZITHROMYCIN 250 MG PO TABS: 1000.0000 mg | ORAL_TABLET | Freq: Once | ORAL | Status: DC

## 2014-04-04 MED ORDER — CITRANATAL HARMONY 27-1-260 MG PO CAPS: 1.0000 | ORAL_CAPSULE | Freq: Every day | ORAL | Status: DC

## 2014-04-04 MED ORDER — CEFIXIME 400 MG PO TABS: 400.0000 mg | ORAL_TABLET | Freq: Once | ORAL | Status: DC

## 2014-04-04 NOTE — Progress Notes (Signed)
  Subjective:    Carmen Noble is a 28 y.o. female being seen today for her obstetrical visit. She is at 7559w0d gestation. Patient reports: backache, headache and insomnia.  Problem List Items Addressed This Visit   None    Visit Diagnoses   Encounter for supervision of other normal pregnancy in first trimester    -  Primary    Relevant Medications       Prenat-FeFmCb-DSS-FA-DHA w/o A (CITRANATAL HARMONY) 27-1-260 MG CAPS    Other Relevant Orders       Obstetric panel       HIV antibody       Varicella zoster antibody, IgG       Vit D  25 hydroxy (rtn osteoporosis monitoring)       Culture, OB Urine       POCT urinalysis dipstick    Chlamydial cervicitis        Relevant Medications       azithromycin (ZITHROMAX) tablet       SUPRAX 400 MG PO TABS      Patient Active Problem List   Diagnosis Date Noted  . Dysmenorrhea 06/27/2013  . Other and unspecified ovarian cyst 06/27/2013    Objective:     BP 123/91  Pulse 86  Temp(Src) 98.8 F (37.1 C)  Wt 137 lb (62.143 kg)  LMP 01/24/2014 Uterine Size: Below umbilicus     Assessment:    Pregnancy @ 359w0d  weeks Doing well    Plan:    Problem list reviewed and updated. Labs reviewed.  Follow up in 4 weeks. FIRST/CF mutation testing/NIPT/QUAD SCREEN/fragile X/Ashkenazi Jewish population testing/Spinal muscular atrophy discussed: requested. Role of ultrasound in pregnancy discussed; fetal survey: requested. Amniocentesis discussed: not indicated.

## 2014-04-04 NOTE — Addendum Note (Signed)
Addended by: Elby BeckPAUL, Yaffa Seckman F on: 04/04/2014 02:49 PM   Modules accepted: Orders

## 2014-04-05 LAB — OBSTETRIC PANEL
Antibody Screen: NEGATIVE
Basophils Absolute: 0 10*3/uL (ref 0.0–0.1)
Basophils Relative: 0 % (ref 0–1)
EOS ABS: 0.2 10*3/uL (ref 0.0–0.7)
Eosinophils Relative: 2 % (ref 0–5)
HEMATOCRIT: 37.1 % (ref 36.0–46.0)
HEMOGLOBIN: 12.8 g/dL (ref 12.0–15.0)
Hepatitis B Surface Ag: NEGATIVE
LYMPHS ABS: 2.2 10*3/uL (ref 0.7–4.0)
Lymphocytes Relative: 28 % (ref 12–46)
MCH: 34.6 pg — AB (ref 26.0–34.0)
MCHC: 34.5 g/dL (ref 30.0–36.0)
MCV: 100.3 fL — ABNORMAL HIGH (ref 78.0–100.0)
MONOS PCT: 8 % (ref 3–12)
Monocytes Absolute: 0.6 10*3/uL (ref 0.1–1.0)
Neutro Abs: 4.8 10*3/uL (ref 1.7–7.7)
Neutrophils Relative %: 62 % (ref 43–77)
Platelets: 480 10*3/uL — ABNORMAL HIGH (ref 150–400)
RBC: 3.7 MIL/uL — ABNORMAL LOW (ref 3.87–5.11)
RDW: 12.7 % (ref 11.5–15.5)
RH TYPE: POSITIVE
Rubella: 5.28 Index — ABNORMAL HIGH (ref <0.90)
WBC: 7.7 10*3/uL (ref 4.0–10.5)

## 2014-04-05 LAB — HIV ANTIBODY (ROUTINE TESTING W REFLEX): HIV 1&2 Ab, 4th Generation: NONREACTIVE

## 2014-04-05 LAB — VARICELLA ZOSTER ANTIBODY, IGG: Varicella IgG: 158.9 Index — ABNORMAL HIGH (ref <135.00)

## 2014-04-05 LAB — TSH: TSH: 1.22 u[IU]/mL (ref 0.350–4.500)

## 2014-04-05 LAB — VITAMIN D 25 HYDROXY (VIT D DEFICIENCY, FRACTURES): Vit D, 25-Hydroxy: 47 ng/mL (ref 30–89)

## 2014-04-10 ENCOUNTER — Other Ambulatory Visit: Payer: Self-pay | Admitting: Obstetrics

## 2014-04-10 ENCOUNTER — Other Ambulatory Visit: Payer: Self-pay | Admitting: *Deleted

## 2014-04-10 DIAGNOSIS — R52 Pain, unspecified: Secondary | ICD-10-CM

## 2014-04-10 MED ORDER — TRAMADOL HCL 50 MG PO TABS: 50.0000 mg | ORAL_TABLET | Freq: Four times a day (QID) | ORAL | Status: DC | PRN

## 2014-04-20 ENCOUNTER — Emergency Department: Payer: Self-pay | Admitting: Internal Medicine

## 2014-04-20 LAB — CBC WITH DIFFERENTIAL/PLATELET
BASOS ABS: 0 10*3/uL (ref 0.0–0.1)
Basophil %: 0.3 %
EOS PCT: 0.4 %
Eosinophil #: 0.1 10*3/uL (ref 0.0–0.7)
HCT: 32.3 % — AB (ref 35.0–47.0)
HGB: 10.5 g/dL — ABNORMAL LOW (ref 12.0–16.0)
LYMPHS ABS: 1.4 10*3/uL (ref 1.0–3.6)
LYMPHS PCT: 11.1 %
MCH: 34.7 pg — ABNORMAL HIGH (ref 26.0–34.0)
MCHC: 32.5 g/dL (ref 32.0–36.0)
MCV: 107 fL — ABNORMAL HIGH (ref 80–100)
MONO ABS: 0.6 x10 3/mm (ref 0.2–0.9)
MONOS PCT: 4.7 %
Neutrophil #: 10.3 10*3/uL — ABNORMAL HIGH (ref 1.4–6.5)
Neutrophil %: 83.5 %
Platelet: 333 10*3/uL (ref 150–440)
RBC: 3.02 10*6/uL — ABNORMAL LOW (ref 3.80–5.20)
RDW: 11.8 % (ref 11.5–14.5)
WBC: 12.4 10*3/uL — ABNORMAL HIGH (ref 3.6–11.0)

## 2014-04-20 LAB — URINALYSIS, COMPLETE
BILIRUBIN, UR: NEGATIVE
BLOOD: NEGATIVE
Bacteria: NONE SEEN
GLUCOSE, UR: NEGATIVE mg/dL (ref 0–75)
Ketone: NEGATIVE
LEUKOCYTE ESTERASE: NEGATIVE
Nitrite: NEGATIVE
PH: 6 (ref 4.5–8.0)
Protein: NEGATIVE
RBC,UR: <1 /HPF (ref 0–5)
Specific Gravity: 1.014 (ref 1.003–1.030)
Squamous Epithelial: 22

## 2014-04-20 LAB — HCG, QUANTITATIVE, PREGNANCY: BETA HCG, QUANT.: 56467 m[IU]/mL — AB

## 2014-04-20 LAB — WET PREP, GENITAL

## 2014-04-20 LAB — GC/CHLAMYDIA PROBE AMP

## 2014-05-02 ENCOUNTER — Encounter: Payer: Self-pay | Admitting: Obstetrics

## 2014-05-02 ENCOUNTER — Ambulatory Visit (INDEPENDENT_AMBULATORY_CARE_PROVIDER_SITE_OTHER): Payer: Medicaid Other | Admitting: Obstetrics

## 2014-05-02 VITALS — BP 110/71 | HR 66 | Temp 98.9°F | Wt 139.0 lb

## 2014-05-02 DIAGNOSIS — Z3482 Encounter for supervision of other normal pregnancy, second trimester: Secondary | ICD-10-CM

## 2014-05-02 DIAGNOSIS — Z3480 Encounter for supervision of other normal pregnancy, unspecified trimester: Secondary | ICD-10-CM

## 2014-05-02 LAB — POCT URINALYSIS DIPSTICK
Bilirubin, UA: NEGATIVE
Blood, UA: NEGATIVE
GLUCOSE UA: NEGATIVE
KETONES UA: NEGATIVE
Leukocytes, UA: NEGATIVE
Nitrite, UA: NEGATIVE
Protein, UA: NEGATIVE
SPEC GRAV UA: 1.01
UROBILINOGEN UA: NEGATIVE
pH, UA: 6

## 2014-05-02 NOTE — Progress Notes (Signed)
  Subjective:    Carmen Noble is a 28 y.o. female being seen today for her obstetrical visit. She is at [redacted]w[redacted]d gestation. Patient reports: no complaints.  Problem List Items Addressed This Visit   None    Visit Diagnoses   Encounter for supervision of other normal pregnancy in second trimester    -  Primary    Relevant Orders       POCT urinalysis dipstick      Patient Active Problem List   Diagnosis Date Noted  . Chlamydial cervicitis 04/04/2014  . Dysmenorrhea 06/27/2013  . Other and unspecified ovarian cyst 06/27/2013    Objective:     BP 110/71  Pulse 66  Temp(Src) 98.9 F (37.2 C)  Wt 139 lb (63.05 kg)  LMP 01/24/2014 Uterine Size: Below umbilicus     Assessment:    Pregnancy @ [redacted]w[redacted]d  weeks Doing well    Plan:    Problem list reviewed and updated. Labs reviewed.  Follow up in 2 weeks. FIRST/CF mutation testing/NIPT/QUAD SCREEN/fragile X/Ashkenazi Jewish population testing/Spinal muscular atrophy discussed: requested. Role of ultrasound in pregnancy discussed; fetal survey: requested. Amniocentesis discussed: not indicated.

## 2014-05-07 ENCOUNTER — Telehealth: Payer: Self-pay | Admitting: *Deleted

## 2014-05-07 NOTE — Telephone Encounter (Signed)
Patient states she is having headaches and has not had a call back. Patient also is requesting refill of her Tramadol. 1:45 Patient states that Dr Clearance Coots was supposed to give her something for her headache and she also needs her Rx for Tramadol.

## 2014-05-08 ENCOUNTER — Other Ambulatory Visit: Payer: Self-pay | Admitting: Obstetrics

## 2014-05-08 DIAGNOSIS — G44019 Episodic cluster headache, not intractable: Secondary | ICD-10-CM

## 2014-05-08 MED ORDER — BUTALBITAL-APAP-CAFFEINE 50-325-40 MG PO TABS: 2.0000 | ORAL_TABLET | Freq: Four times a day (QID) | ORAL | Status: DC | PRN

## 2014-05-08 NOTE — Telephone Encounter (Signed)
Fioricet Rx No refill for Tramadol.  Patient has 2 refills from Rx 04-10-14.

## 2014-05-08 NOTE — Telephone Encounter (Signed)
Patient notified per Steward Drone- patient to pick up Rx in office for Fioricet

## 2014-05-16 ENCOUNTER — Ambulatory Visit (INDEPENDENT_AMBULATORY_CARE_PROVIDER_SITE_OTHER): Payer: Medicaid Other | Admitting: Obstetrics

## 2014-05-16 ENCOUNTER — Encounter: Payer: Self-pay | Admitting: Obstetrics

## 2014-05-16 VITALS — BP 122/73 | HR 81 | Temp 98.3°F | Wt 141.8 lb

## 2014-05-16 DIAGNOSIS — Z1389 Encounter for screening for other disorder: Secondary | ICD-10-CM

## 2014-05-16 DIAGNOSIS — Z3480 Encounter for supervision of other normal pregnancy, unspecified trimester: Secondary | ICD-10-CM

## 2014-05-16 DIAGNOSIS — Z3482 Encounter for supervision of other normal pregnancy, second trimester: Secondary | ICD-10-CM

## 2014-05-16 NOTE — Progress Notes (Signed)
  Subjective:    Carmen Noble is a 28 y.o. female being seen today for her obstetrical visit. She is at [redacted]w[redacted]d gestation. Patient reports: backache.  Problem List Items Addressed This Visit   None    Visit Diagnoses   Encounter for routine screening for malformation using ultrasonics    -  Primary    Relevant Orders       US OB Comp + 14 Wk      Patient Active Problem List   Diagnosis Date Noted  . Chlamydial cervicitis 04/04/2014  . Dysmenorrhea 06/27/2013  . Other and unspecified ovarian cyst 06/27/2013    Objective:     BP 122/73  Pulse 81  Temp(Src) 98.3 F (36.8 C)  Wt 141 lb 12.8 oz (64.32 kg)  LMP 01/24/2014 Uterine Size: Below umbilicus     Assessment:    Pregnancy @ [redacted]w[redacted]d  weeks Doing well    Plan:    Problem list reviewed and updated. Labs reviewed.  Follow up in 4 weeks. FIRST/CF mutation testing/NIPT/QUAD SCREEN/fragile X/Ashkenazi Jewish population testing/Spinal muscular atrophy discussed: declined. Role of ultrasound in pregnancy discussed; fetal survey: requested. Amniocentesis discussed: not indicated.

## 2014-05-21 ENCOUNTER — Encounter (HOSPITAL_COMMUNITY): Payer: Self-pay | Admitting: *Deleted

## 2014-05-21 ENCOUNTER — Other Ambulatory Visit: Payer: Self-pay | Admitting: Obstetrics

## 2014-05-21 ENCOUNTER — Inpatient Hospital Stay (HOSPITAL_COMMUNITY)
Admission: AD | Admit: 2014-05-21 | Discharge: 2014-05-22 | Disposition: A | Discharge status: Home / Self Care | Payer: Medicaid Other | Source: Ambulatory Visit | Attending: Obstetrics & Gynecology | Admitting: Obstetrics & Gynecology

## 2014-05-21 DIAGNOSIS — O99891 Other specified diseases and conditions complicating pregnancy: Secondary | ICD-10-CM | POA: Insufficient documentation

## 2014-05-21 DIAGNOSIS — F17200 Nicotine dependence, unspecified, uncomplicated: Secondary | ICD-10-CM | POA: Insufficient documentation

## 2014-05-21 DIAGNOSIS — O9989 Other specified diseases and conditions complicating pregnancy, childbirth and the puerperium: Principal | ICD-10-CM

## 2014-05-21 DIAGNOSIS — J209 Acute bronchitis, unspecified: Secondary | ICD-10-CM | POA: Insufficient documentation

## 2014-05-21 NOTE — Telephone Encounter (Signed)
Please advise on refill.

## 2014-05-21 NOTE — MAU Provider Note (Signed)
History     CSN: [id number]  Arrival date and time: 05/21/14 2321   First Provider Initiated Contact with Patient 05/21/14 2353      No chief complaint on file.  HPI [name] is a 28 y.o. [phone] who presents to MAU today with complaint of cough, nasal congestion and SOB since 05/14/14. She denies sore throat, fever, asthma or sick contacts. She is a current everyday smoker. She states that she saw Dr. Clearance Coots on 05/16/14 and was told to get Robitussin. She states that she went to the store to get it multiple times and they didn't have any. She has not taken anything.   OB History   Grav Para Term Preterm Abortions TAB SAB Ect Mult Living   Past Medical History  Diagnosis Date  . Hypertension   . Migraine     Past Surgical History  Procedure Laterality Date  . Hernia repair    . Appendectomy    . Ankle surgery    . Mouth surgery      Family History  Problem Relation Age of Onset  . Diabetes Father     History  Substance Use Topics  . Smoking status: Current Every Day Smoker -- 1.00 packs/day    Types: Cigarettes  . Smokeless tobacco: Never Used     Comment: thinking about it  . Alcohol Use: No    Allergies:  Allergies  Allergen Reactions  . Bee Venom Anaphylaxis  . Penicillins Anaphylaxis    Prescriptions prior to admission  Medication Sig Dispense Refill  . butalbital-acetaminophen-caffeine (FIORICET) 50-325-40 MG per tablet Take 2 tablets by mouth every 6 (six) hours as needed for headache.  40 tablet  2  . Prenat-FeFmCb-DSS-FA-DHA w/o A (CITRANATAL HARMONY) 27-1-260 MG CAPS Take 1 capsule by mouth daily before breakfast.  90 capsule  3  . traMADol (ULTRAM) 50 MG tablet Take 1-2 tablets (50-100 mg total) by mouth every 6 (six) hours as needed for moderate pain.  40 tablet  2  . acetaminophen (TYLENOL) 325 MG tablet Take 650 mg by mouth every 6 (six) hours as needed for headache.        Review of Systems  Constitutional:  Negative for fever and malaise/fatigue.  HENT: Positive for congestion. Negative for sore throat.   Respiratory: Positive for cough, shortness of breath and wheezing.   Gastrointestinal: Positive for abdominal pain.   Physical Exam   Blood pressure 117/54, pulse 107, temperature 98.5 F (36.9 C), temperature source Oral, resp. rate 22, height  (1.6 m), weight 144 lb 2 oz (65.375 kg), last menstrual period 01/24/2014, SpO2 100.00%.  Physical Exam  Constitutional: She is oriented to person, place, and time. She appears well-developed and well-nourished. No distress.  HENT:  Head: Normocephalic.  Cardiovascular: Normal rate, regular rhythm and normal heart sounds.   Respiratory: Effort normal. No respiratory distress. She has wheezes (bilatearl wheezes noted in the lower lung fields). She has no rales.  Neurological: She is alert and oriented to person, place, and time.  Skin: Skin is warm and dry. No erythema.  Psychiatric: She has a normal mood and affect.    Dg Chest 2 View  05/22/2014   CLINICAL DATA:  Cough and wheezing in the second trimester.  EXAM: CHEST  2 VIEW  COMPARISON:  01/06/2007 chest CT  FINDINGS: Questionable/mild bronchial wall thickening. There is no edema, consolidation, effusion, or pneumothorax.  Normal heart size and mediastinal contours. Intact bony thorax.  IMPRESSION: Negative for pneumonia.   Electronically Signed   By: Tiburcio Pea M.D.   On: 05/22/2014 01:09    MAU Course  Procedures None  MDM FHR - 147 bpm with doppler  Assessment and Plan  A: SIUP at [redacted]w[redacted]d Acute Bronchitis  P: Discharge home Rx for Z-pack and Albuterol inhaler given to patient List of OTC meds given for symptomatic relief given Encouraged to rest and increase PO hydration Patient advised to follow-up with Dr. Clearance Coots as scheduled or sooner as needed Patient may return to MAU as needed or if her condition were to change or worsen   Marny Lowenstein, PA-C  05/22/2014, 1:18  AM

## 2014-05-21 NOTE — MAU Note (Addendum)
PT SAYS  SHE  FELT LIKE SHE HAD A COLD-  RUNNY NOSE  AND COUGH  SINCE 9-15  WITH WHEEZING.  WAS IN OFFICE ON 9-17-  GAVE ROBITUSSIN-  NONE IN PHARMACY-  CALLED  DR-     SAYS  SHE HA CONT  TO GET WORSE.      SAYS SHE ALSO HAS HERNIA MESH REPAIR IN ABD-   WITH DR Derrell Lolling-  2010.      SAYS ABD  STARTED HURTING  ON 9-17-    COMES/ GOES.    PT  ALSO  SMOKES-  LAST  2300. -    TONIGHT AT 1030PM-  SHE CALLED OFFICE  - TOLD TO COME IN- MIGHT BE BRONCHITIS OR PNEUMONIA.

## 2014-05-22 ENCOUNTER — Inpatient Hospital Stay (HOSPITAL_COMMUNITY): Payer: Medicaid Other

## 2014-05-22 DIAGNOSIS — R05 Cough: Secondary | ICD-10-CM | POA: Diagnosis present

## 2014-05-22 DIAGNOSIS — J209 Acute bronchitis, unspecified: Secondary | ICD-10-CM | POA: Diagnosis not present

## 2014-05-22 DIAGNOSIS — O99891 Other specified diseases and conditions complicating pregnancy: Secondary | ICD-10-CM | POA: Diagnosis not present

## 2014-05-22 DIAGNOSIS — R059 Cough, unspecified: Secondary | ICD-10-CM | POA: Diagnosis present

## 2014-05-22 DIAGNOSIS — F17200 Nicotine dependence, unspecified, uncomplicated: Secondary | ICD-10-CM | POA: Diagnosis not present

## 2014-05-22 MED ORDER — IPRATROPIUM-ALBUTEROL 0.5-2.5 (3) MG/3ML IN SOLN
3.0000 mL | Freq: Once | RESPIRATORY_TRACT | Status: AC
  Administered 2014-05-22: 3 mL via RESPIRATORY_TRACT
  Filled 2014-05-22: qty 3

## 2014-05-22 MED ORDER — ALBUTEROL SULFATE HFA 108 (90 BASE) MCG/ACT IN AERS: 2.0000 | INHALATION_SPRAY | Freq: Four times a day (QID) | RESPIRATORY_TRACT | Status: AC | PRN

## 2014-05-22 MED ORDER — AZITHROMYCIN 250 MG PO TABS: ORAL_TABLET | ORAL | Status: DC

## 2014-05-22 NOTE — Discharge Instructions (Signed)

## 2014-05-23 ENCOUNTER — Ambulatory Visit (HOSPITAL_COMMUNITY)
Admission: RE | Admit: 2014-05-23 | Discharge: 2014-05-23 | Disposition: A | Discharge status: Home / Self Care | Payer: Medicaid Other | Source: Ambulatory Visit | Attending: Obstetrics | Admitting: Obstetrics

## 2014-05-23 DIAGNOSIS — Z1389 Encounter for screening for other disorder: Secondary | ICD-10-CM | POA: Insufficient documentation

## 2014-05-23 DIAGNOSIS — Z363 Encounter for antenatal screening for malformations: Secondary | ICD-10-CM | POA: Insufficient documentation

## 2014-06-13 ENCOUNTER — Encounter: Payer: Self-pay | Admitting: Obstetrics

## 2014-06-13 ENCOUNTER — Ambulatory Visit (INDEPENDENT_AMBULATORY_CARE_PROVIDER_SITE_OTHER): Payer: Medicaid Other | Admitting: Obstetrics

## 2014-06-13 VITALS — BP 123/80 | HR 83 | Temp 98.3°F | Wt 148.0 lb

## 2014-06-13 DIAGNOSIS — O99342 Other mental disorders complicating pregnancy, second trimester: Secondary | ICD-10-CM

## 2014-06-13 DIAGNOSIS — F329 Major depressive disorder, single episode, unspecified: Secondary | ICD-10-CM

## 2014-06-13 DIAGNOSIS — F32A Depression, unspecified: Secondary | ICD-10-CM

## 2014-06-13 DIAGNOSIS — G44019 Episodic cluster headache, not intractable: Secondary | ICD-10-CM

## 2014-06-13 DIAGNOSIS — Z3482 Encounter for supervision of other normal pregnancy, second trimester: Secondary | ICD-10-CM

## 2014-06-13 LAB — POCT URINALYSIS DIPSTICK
BILIRUBIN UA: NEGATIVE
Blood, UA: NEGATIVE
CLARITY UA: NEGATIVE
GLUCOSE UA: NEGATIVE
Ketones, UA: NEGATIVE
LEUKOCYTES UA: NEGATIVE
Nitrite, UA: NEGATIVE
Protein, UA: NEGATIVE
Spec Grav, UA: <=1.005
Urobilinogen, UA: NEGATIVE
pH, UA: 6.5

## 2014-06-13 MED ORDER — BUTALBITAL-APAP-CAFFEINE 50-325-40 MG PO TABS: 2.0000 | ORAL_TABLET | Freq: Four times a day (QID) | ORAL | Status: DC | PRN

## 2014-06-13 MED ORDER — SERTRALINE HCL 25 MG PO TABS: 25.0000 mg | ORAL_TABLET | Freq: Every day | ORAL | Status: DC

## 2014-06-13 NOTE — Progress Notes (Signed)
Subjective:    Carmen Noble is a [age] y.o. female being seen today for her obstetrical visit. She is at 7327w6d gestation. Patient reports: headache and depression . Fetal movement: normal.  Problem List Items Addressed This Visit   None    Visit Diagnoses   Encounter for supervision of other normal pregnancy in second trimester    -  Primary    Relevant Orders       POCT urinalysis dipstick (Completed)    Episodic cluster headache, not intractable        Relevant Medications       butalbital-acetaminophen-caffeine (FIORICET) 50-325-40 MG per tablet       sertraline (ZOLOFT) tablet    Depression complicating pregnancy, antepartum, second trimester        Relevant Medications       sertraline (ZOLOFT) tablet      Patient Active Problem List   Diagnosis Date Noted  . Chlamydial cervicitis 04/04/2014  . Dysmenorrhea 06/27/2013  . Other and unspecified ovarian cyst 06/27/2013   Objective:    BP 123/80  Pulse 83  Temp(Src) 98.3 F (36.8 C)  Wt 148 lb (67.132 kg)  LMP 01/24/2014 FHT: 150 BPM  Uterine Size: size equals dates     Assessment:    Pregnancy @ 7327w6d    Plan:    OBGCT: ordered for next visit.  Labs, problem list reviewed and updated 2 hr GTT planned Follow up in 4 weeks.

## 2014-06-24 ENCOUNTER — Telehealth: Payer: Self-pay | Admitting: *Deleted

## 2014-06-24 NOTE — Telephone Encounter (Signed)
Patient is requesting a refill on Tramadol.  Attempted to contact patient and left message for patient to contact the office.

## 2014-06-25 NOTE — Telephone Encounter (Signed)
Patient returned call.  Patient states she is having back pain. Patient is requesting a refill on Tramadol.

## 2014-07-01 ENCOUNTER — Encounter: Payer: Self-pay | Admitting: Obstetrics

## 2014-07-02 NOTE — Telephone Encounter (Signed)
Patient called again stating she is still having the back pain and is also having severe headaches. Patient is also requesting a refill on Fioricet.

## 2014-07-03 ENCOUNTER — Other Ambulatory Visit: Payer: Self-pay | Admitting: Obstetrics

## 2014-07-03 DIAGNOSIS — G44019 Episodic cluster headache, not intractable: Secondary | ICD-10-CM

## 2014-07-03 DIAGNOSIS — R52 Pain, unspecified: Secondary | ICD-10-CM

## 2014-07-03 MED ORDER — TRAMADOL HCL 50 MG PO TABS: 50.0000 mg | ORAL_TABLET | Freq: Four times a day (QID) | ORAL | Status: DC | PRN

## 2014-07-03 MED ORDER — BUTALBITAL-APAP-CAFFEINE 50-325-40 MG PO TABS: 2.0000 | ORAL_TABLET | Freq: Four times a day (QID) | ORAL | Status: DC | PRN

## 2014-07-03 NOTE — Telephone Encounter (Signed)
Tramadol and Fioricet Rx.

## 2014-07-11 ENCOUNTER — Telehealth: Payer: Self-pay | Admitting: *Deleted

## 2014-07-11 NOTE — Telephone Encounter (Signed)
Patient called stating she is currently on Zoloft 25 mg daily and has been for the last month. Patient states she is still feeling down and depressed. Patient wants to know what else can be done.  Spoke with Dr. Clearance CootsHarper and he advised that the patient increase her dose to 50 mg daily.  Attempted to contact the patient and left message with recommendation.

## 2014-07-15 ENCOUNTER — Other Ambulatory Visit: Payer: Medicaid Other

## 2014-07-15 ENCOUNTER — Ambulatory Visit (INDEPENDENT_AMBULATORY_CARE_PROVIDER_SITE_OTHER): Payer: Medicaid Other | Admitting: Obstetrics

## 2014-07-15 ENCOUNTER — Encounter: Payer: Self-pay | Admitting: Obstetrics

## 2014-07-15 VITALS — BP 124/64 | HR 90 | Temp 97.5°F | Wt 156.0 lb

## 2014-07-15 DIAGNOSIS — Z3490 Encounter for supervision of normal pregnancy, unspecified, unspecified trimester: Secondary | ICD-10-CM

## 2014-07-15 DIAGNOSIS — Z3482 Encounter for supervision of other normal pregnancy, second trimester: Secondary | ICD-10-CM

## 2014-07-15 LAB — POCT URINALYSIS DIPSTICK
Bilirubin, UA: NEGATIVE
Blood, UA: NEGATIVE
Glucose, UA: NEGATIVE
Ketones, UA: NEGATIVE
Leukocytes, UA: NEGATIVE
NITRITE UA: NEGATIVE
PH UA: 6.5
PROTEIN UA: NEGATIVE
Spec Grav, UA: 1.015
Urobilinogen, UA: NEGATIVE

## 2014-07-15 NOTE — Progress Notes (Signed)
Subjective:    Carmen Noble is a 28 y.o. female being seen today for her obstetrical visit. She is at 4725w3d gestation. Patient reports: no complaints . Fetal movement: normal.  Problem List Items Addressed This Visit    None    Visit Diagnoses    Encounter for supervision of other normal pregnancy in second trimester    -  Primary    Relevant Orders       Glucose Tolerance, 2 Hours w/1 Hour       CBC       HIV antibody       RPR       POCT urinalysis dipstick (Completed)      Patient Active Problem List   Diagnosis Date Noted  . Chlamydial cervicitis 04/04/2014  . Dysmenorrhea 06/27/2013  . Other and unspecified ovarian cyst 06/27/2013   Objective:    BP 124/64 mmHg  Pulse 90  Temp(Src) 97.5 F (36.4 C)  Wt 156 lb (70.761 kg)  LMP 01/24/2014 FHT: 150 BPM  Uterine Size: size equals dates     Assessment:    Pregnancy @ 5425w3d    Plan:    OBGCT: ordered.  Labs, problem list reviewed and updated 2 hr GTT planned Follow up in 4 weeks.

## 2014-07-16 ENCOUNTER — Ambulatory Visit: Payer: Medicaid Other | Admitting: Obstetrics

## 2014-07-16 ENCOUNTER — Telehealth: Payer: Self-pay | Admitting: Obstetrics

## 2014-07-16 LAB — HIV ANTIBODY (ROUTINE TESTING W REFLEX): HIV: NONREACTIVE

## 2014-07-16 LAB — GLUCOSE TOLERANCE, 2 HOURS W/ 1HR
GLUCOSE, 2 HOUR: 44 mg/dL — AB (ref 70–139)
GLUCOSE, FASTING: 63 mg/dL — AB (ref 70–99)
GLUCOSE: 113 mg/dL (ref 70–170)

## 2014-07-16 LAB — CBC
HCT: 30.9 % — ABNORMAL LOW (ref 36.0–46.0)
Hemoglobin: 10.4 g/dL — ABNORMAL LOW (ref 12.0–15.0)
MCH: 35.4 pg — ABNORMAL HIGH (ref 26.0–34.0)
MCHC: 33.7 g/dL (ref 30.0–36.0)
MCV: 105.1 fL — ABNORMAL HIGH (ref 78.0–100.0)
MPV: 9.1 fL — AB (ref 9.4–12.4)
PLATELETS: 412 10*3/uL — AB (ref 150–400)
RBC: 2.94 MIL/uL — AB (ref 3.87–5.11)
RDW: 13.3 % (ref 11.5–15.5)
WBC: 7.6 10*3/uL (ref 4.0–10.5)

## 2014-07-16 LAB — RPR

## 2014-07-16 NOTE — Telephone Encounter (Signed)
Linda calling from [facility] -- reporting Critical Lab -- 2 Hour glucose drawn yesterday 11/16 at 10:53 was 44.  Lab repeated and verified results. Results given to Dr Coral Ceoharles Harper via phone at 8:40 am.

## 2014-07-22 ENCOUNTER — Telehealth: Payer: Self-pay | Admitting: *Deleted

## 2014-07-22 NOTE — Telephone Encounter (Signed)
Patient called stating she has had nausea and vomiting since Saturday 07-20-14. Patient states she has a mesh implant in her stomach and believes she may have "popped it". Patient states she is having severe pain in the upper left quadrant of her abdomen. Patient advised to be seen at MAU.

## 2014-07-23 ENCOUNTER — Inpatient Hospital Stay (HOSPITAL_COMMUNITY): Payer: Medicaid Other

## 2014-07-23 ENCOUNTER — Encounter (HOSPITAL_COMMUNITY): Payer: Self-pay | Admitting: *Deleted

## 2014-07-23 ENCOUNTER — Inpatient Hospital Stay (HOSPITAL_COMMUNITY)
Admission: AD | Admit: 2014-07-23 | Discharge: 2014-07-23 | Disposition: A | Discharge status: Home / Self Care | Payer: Medicaid Other | Source: Ambulatory Visit | Attending: Obstetrics | Admitting: Obstetrics

## 2014-07-23 ENCOUNTER — Other Ambulatory Visit: Payer: Self-pay | Admitting: Obstetrics

## 2014-07-23 DIAGNOSIS — Z3A27 27 weeks gestation of pregnancy: Secondary | ICD-10-CM | POA: Insufficient documentation

## 2014-07-23 DIAGNOSIS — O9989 Other specified diseases and conditions complicating pregnancy, childbirth and the puerperium: Secondary | ICD-10-CM | POA: Diagnosis not present

## 2014-07-23 DIAGNOSIS — R1012 Left upper quadrant pain: Secondary | ICD-10-CM | POA: Insufficient documentation

## 2014-07-23 DIAGNOSIS — O99332 Smoking (tobacco) complicating pregnancy, second trimester: Secondary | ICD-10-CM | POA: Diagnosis not present

## 2014-07-23 DIAGNOSIS — O26899 Other specified pregnancy related conditions, unspecified trimester: Secondary | ICD-10-CM | POA: Insufficient documentation

## 2014-07-23 DIAGNOSIS — R109 Unspecified abdominal pain: Secondary | ICD-10-CM | POA: Diagnosis present

## 2014-07-23 DIAGNOSIS — R51 Headache: Secondary | ICD-10-CM | POA: Diagnosis not present

## 2014-07-23 DIAGNOSIS — Z3A25 25 weeks gestation of pregnancy: Secondary | ICD-10-CM | POA: Insufficient documentation

## 2014-07-23 DIAGNOSIS — F17210 Nicotine dependence, cigarettes, uncomplicated: Secondary | ICD-10-CM | POA: Diagnosis not present

## 2014-07-23 DIAGNOSIS — O26892 Other specified pregnancy related conditions, second trimester: Secondary | ICD-10-CM

## 2014-07-23 DIAGNOSIS — R1011 Right upper quadrant pain: Secondary | ICD-10-CM

## 2014-07-23 HISTORY — DX: Depression, unspecified: F32.A

## 2014-07-23 HISTORY — DX: Major depressive disorder, single episode, unspecified: F32.9

## 2014-07-23 LAB — URINALYSIS, ROUTINE W REFLEX MICROSCOPIC
BILIRUBIN URINE: NEGATIVE
Glucose, UA: NEGATIVE mg/dL
HGB URINE DIPSTICK: NEGATIVE
Ketones, ur: NEGATIVE mg/dL
Leukocytes, UA: NEGATIVE
Nitrite: NEGATIVE
Protein, ur: NEGATIVE mg/dL
SPECIFIC GRAVITY, URINE: 1.02 (ref 1.005–1.030)
UROBILINOGEN UA: 0.2 mg/dL (ref 0.0–1.0)
pH: 6.5 (ref 5.0–8.0)

## 2014-07-23 MED ORDER — ACETAMINOPHEN 500 MG PO TABS
1000.0000 mg | ORAL_TABLET | Freq: Once | ORAL | Status: AC
  Administered 2014-07-23: 1000 mg via ORAL
  Filled 2014-07-23: qty 2

## 2014-07-23 NOTE — MAU Note (Signed)
Patient states she has been having upper left abdominal pain and back pain. Denies bleeding or leaking.

## 2014-07-23 NOTE — MAU Provider Note (Signed)
History     CSN: [id number]  Arrival date and time: 07/23/14 1001   None     Chief Complaint  Patient presents with  . Abdominal Pain   Abdominal Pain Associated symptoms include nausea. Pertinent negatives include no constipation, diarrhea, dysuria, fever, hematuria or vomiting.     [name] is a 28 year old 74P2012 female presents to the ED with complaints of abdominal pain. She states the pain started on Saturday after she was experiencing nausea and vomited once. She describes the pain as dull, aching, and is located in the LUQ. The patient believes the pain is due to a prior hernia repair performed in 2010.  The pain is worse when she coughs or raises her arm above her head and is alleviated when she puts pressure on her abdomen. Pt also reports mild h/a today but has not taken anything for pain.  No VB, LOF, contractions.     Past Medical History  Diagnosis Date  . Hypertension   . Migraine   . Depression     on zoloft    Past Surgical History  Procedure Laterality Date  . Hernia repair    . Appendectomy    . Ankle surgery    . Mouth surgery    . Hernia repair  2010  . Appendectomy  2008    Family History  Problem Relation Age of Onset  . Diabetes Father     History  Substance Use Topics  . Smoking status: Current Every Day Smoker -- 0.50 packs/day for 15 years    Types: Cigarettes  . Smokeless tobacco: Never Used     Comment: thinking about it  . Alcohol Use: No    Allergies:  Allergies  Allergen Reactions  . Bee Venom Anaphylaxis  . Penicillins Anaphylaxis    Prescriptions prior to admission  Medication Sig Dispense Refill Last Dose  . acetaminophen (TYLENOL) 325 MG tablet Take 650 mg by mouth every 6 (six) hours as needed for headache.   Taking  . albuterol (PROVENTIL HFA;VENTOLIN HFA) 108 (90 BASE) MCG/ACT inhaler Inhale 2 puffs into the lungs every 6 (six) hours as needed for wheezing or shortness of breath. 1 Inhaler 1 Taking  .  butalbital-acetaminophen-caffeine (FIORICET) 50-325-40 MG per tablet Take 2 tablets by mouth every 6 (six) hours as needed for headache. 40 tablet 2 Taking  . Prenat-FeFmCb-DSS-FA-DHA w/o A (CITRANATAL HARMONY) 27-1-260 MG CAPS Take 1 capsule by mouth daily before breakfast. 90 capsule 3 Taking  . sertraline (ZOLOFT) 25 MG tablet Take 1 tablet (25 mg total) by mouth daily. (Patient taking differently: Take 25 mg by mouth daily. Patient to increase to 50 mg.) 30 tablet 11 Taking  . traMADol (ULTRAM) 50 MG tablet Take 1-2 tablets (50-100 mg total) by mouth every 6 (six) hours as needed for moderate pain. 40 tablet 2 Taking    Review of Systems  Constitutional: Negative for fever and chills.  Respiratory: Negative for cough and shortness of breath.   Cardiovascular: Negative for chest pain.  Gastrointestinal: Positive for nausea and abdominal pain. Negative for vomiting, diarrhea and constipation.  Genitourinary: Negative for dysuria and hematuria.   Physical Exam   Blood pressure 114/66, pulse 69, temperature 98.6 F (37 C), temperature source Oral, resp. rate 16, height 5' 3.5" (1.613 m), weight 70.761 kg (156 lb), last menstrual period 01/24/2014.  Physical Exam  Constitutional: She appears well-developed and well-nourished.  HENT:  Head: Normocephalic.  Neck: Normal range of motion.  Cardiovascular: Normal  rate, regular rhythm and normal heart sounds.   Respiratory: Effort normal and breath sounds normal.  GI: Soft. Bowel sounds are normal. She exhibits no distension. There is tenderness.  LUQ pain upon palpation. Soft, small herniation when patient flexes neck. No tenderness in epigastric region   Neurological: She is alert.  Skin: Skin is warm and dry.  LUQ pain is located at upper edge of pt fundus.  FHR 145 with  Moderate variability, no decels, positive accels (10x10) No contractions on toco or to palpation  Preliminary U/S report: Limited OB U/S Anterior placenta above os  without abnormality, normal amniotic fluid level, normal cervical length and FHR  MAU Course  Procedures  MDM OB U/S  Assessment and Plan   1. Headache in pregnancy, antepartum, second trimester   2. Pregnancy with abdominal pain of left upper quadrant, antepartum      P: D/C home Reviewed normal U/S findings with pt Discussed umbilical hernia location and how pt pain is not located there so unlikely the cause of her pain Rest/ice/heat/Tylenol for pain.  Pt given Tylenol in MAU for h/a with good relief. Fetal kick counts and PTL signs reviewed F/U with Dr Clearance CootsHarper as scheduled Return to MAU as needed for emergencies    LEFTWICH-KIRBY, Nino Amano Certified Nurse-Midwife

## 2014-07-23 NOTE — Discharge Instructions (Signed)

## 2014-07-29 ENCOUNTER — Telehealth: Payer: Self-pay | Admitting: *Deleted

## 2014-07-29 NOTE — Telephone Encounter (Signed)
Pt called to office requesting refill on Fioricet.  Pt states in message that her HA are getting worse and she doesn't know what to do.  Return call to pt.  Made pt aware that request could be sent to Dr Clearance CootsHarper but he has previously refused refill sent from pharmacy.  Pt states that she has taken all that has been prescribed including refills.     Please advise on refill.

## 2014-07-30 ENCOUNTER — Other Ambulatory Visit: Payer: Self-pay | Admitting: *Deleted

## 2014-07-30 DIAGNOSIS — R51 Headache: Principal | ICD-10-CM

## 2014-07-30 DIAGNOSIS — R519 Headache, unspecified: Secondary | ICD-10-CM

## 2014-07-30 NOTE — Telephone Encounter (Signed)
Call placed to pt.  LM on VM making pt aware of recommendations.  Stating that referral would be ordered and sent in order for pt to get appt.  Pt advised that if she did not want referral that she could discuss at next appt with Dr Clearance CootsHarper.

## 2014-07-30 NOTE — Telephone Encounter (Signed)
[facility] needs referral to [facility].

## 2014-07-31 ENCOUNTER — Telehealth: Payer: Self-pay

## 2014-07-31 NOTE — Telephone Encounter (Signed)
We are not on patient's medicaid card, she is Insurance account managercalling [facility] who is on her card for appt and they will refer her.

## 2014-08-07 ENCOUNTER — Other Ambulatory Visit: Payer: Self-pay | Admitting: Obstetrics

## 2014-08-07 DIAGNOSIS — G44019 Episodic cluster headache, not intractable: Secondary | ICD-10-CM

## 2014-08-07 MED ORDER — BUTALBITAL-APAP-CAFFEINE 50-325-40 MG PO TABS: 2.0000 | ORAL_TABLET | Freq: Four times a day (QID) | ORAL | Status: DC | PRN

## 2014-08-08 ENCOUNTER — Telehealth: Payer: Self-pay | Admitting: *Deleted

## 2014-08-08 NOTE — Telephone Encounter (Signed)
Patient requested a refill on Fioricet. Patient states she has an appointment with the [facility] on 09-12-14. Patient notified that Dr. Clearance CootsHarper will refill the Fioricet this time. Patient advised she will not receive another refill if she doesn't;t keep her appointment. Patient verbalized understanding.

## 2014-08-12 ENCOUNTER — Ambulatory Visit (INDEPENDENT_AMBULATORY_CARE_PROVIDER_SITE_OTHER): Payer: Medicaid Other | Admitting: Obstetrics

## 2014-08-12 ENCOUNTER — Encounter: Payer: Self-pay | Admitting: Obstetrics

## 2014-08-12 VITALS — BP 134/68 | HR 85 | Temp 98.6°F | Wt 156.0 lb

## 2014-08-12 DIAGNOSIS — O99343 Other mental disorders complicating pregnancy, third trimester: Secondary | ICD-10-CM | POA: Diagnosis not present

## 2014-08-12 DIAGNOSIS — Z3483 Encounter for supervision of other normal pregnancy, third trimester: Secondary | ICD-10-CM

## 2014-08-12 DIAGNOSIS — F329 Major depressive disorder, single episode, unspecified: Secondary | ICD-10-CM

## 2014-08-12 LAB — POCT URINALYSIS DIPSTICK
Bilirubin, UA: NEGATIVE
Blood, UA: NEGATIVE
GLUCOSE UA: NEGATIVE
Ketones, UA: NEGATIVE
LEUKOCYTES UA: NEGATIVE
NITRITE UA: NEGATIVE
Spec Grav, UA: <=1.005
Urobilinogen, UA: NEGATIVE
pH, UA: 6.5

## 2014-08-12 MED ORDER — SERTRALINE HCL 50 MG PO TABS: 50.0000 mg | ORAL_TABLET | Freq: Every day | ORAL | Status: DC

## 2014-08-12 NOTE — Progress Notes (Signed)
Subjective:    Carmen Noble is a 28 y.o. female being seen today for her obstetrical visit. She is at 5534w3d gestation. Patient reports no complaints. Fetal movement: normal.  Problem List Items Addressed This Visit    None    Visit Diagnoses    Encounter for supervision of other normal pregnancy in third trimester    -  Primary    Relevant Orders       POCT urinalysis dipstick (Completed)    Depression complicating pregnancy, antepartum, third trimester        Relevant Medications       sertraline (ZOLOFT) tablet      Patient Active Problem List   Diagnosis Date Noted  . Pregnancy with abdominal pain of left upper quadrant, antepartum   . [redacted] weeks gestation of pregnancy   . Chlamydial cervicitis 04/04/2014  . Dysmenorrhea 06/27/2013  . Other and unspecified ovarian cyst 06/27/2013   Objective:    BP 134/68 mmHg  Pulse 85  Temp(Src) 98.6 F (37 C)  Wt 156 lb (70.761 kg)  LMP 01/24/2014 FHT:  150 BPM  Uterine Size: size equals dates  Presentation: unsure     Assessment:    Pregnancy @ 3334w3d weeks   Plan:     labs reviewed, problem list updated Consent signed. GBS sent TDAP offered  Rhogam given for RH negative Pediatrician: discussed. Infant feeding: plans to breastfeed. Maternity leave: not discussed. Cigarette smoking: smokes 0.5 PPD. Orders Placed This Encounter  Procedures  . POCT urinalysis dipstick   Meds ordered this encounter  Medications  . sertraline (ZOLOFT) 50 MG tablet    Sig: Take 1 tablet (50 mg total) by mouth daily.    Dispense:  30 tablet    Refill:  11   Follow up in 2 Weeks.

## 2014-08-18 ENCOUNTER — Inpatient Hospital Stay (HOSPITAL_COMMUNITY)
Admission: AD | Admit: 2014-08-18 | Discharge: 2014-08-19 | Disposition: A | Discharge status: Home / Self Care | Payer: Medicaid Other | Source: Ambulatory Visit | Attending: Obstetrics & Gynecology | Admitting: Obstetrics & Gynecology

## 2014-08-18 DIAGNOSIS — Z3A29 29 weeks gestation of pregnancy: Secondary | ICD-10-CM | POA: Diagnosis not present

## 2014-08-18 DIAGNOSIS — Z720 Tobacco use: Secondary | ICD-10-CM | POA: Diagnosis not present

## 2014-08-18 DIAGNOSIS — O26899 Other specified pregnancy related conditions, unspecified trimester: Secondary | ICD-10-CM

## 2014-08-18 DIAGNOSIS — O26853 Spotting complicating pregnancy, third trimester: Secondary | ICD-10-CM | POA: Diagnosis present

## 2014-08-18 DIAGNOSIS — R109 Unspecified abdominal pain: Secondary | ICD-10-CM | POA: Diagnosis present

## 2014-08-18 LAB — URINALYSIS, ROUTINE W REFLEX MICROSCOPIC
Bilirubin Urine: NEGATIVE
Glucose, UA: NEGATIVE mg/dL
Hgb urine dipstick: NEGATIVE
Ketones, ur: NEGATIVE mg/dL
Leukocytes, UA: NEGATIVE
Nitrite: NEGATIVE
PROTEIN: NEGATIVE mg/dL
Specific Gravity, Urine: 1.01 (ref 1.005–1.030)
UROBILINOGEN UA: 0.2 mg/dL (ref 0.0–1.0)
pH: 6.5 (ref 5.0–8.0)

## 2014-08-18 NOTE — MAU Note (Signed)
Abdominal pain since 930 pm, states starting to feel like contractions and happening every few minutes. Vaginal spotting since 630 pm tonight; red. Positive fetal movement.

## 2014-08-18 NOTE — MAU Note (Signed)
Pt states abdominal pain/contractions began tonight around "930 and noted some "red" spotting as well"  Pt denies the need to wear a pad for spotting

## 2014-08-19 DIAGNOSIS — R109 Unspecified abdominal pain: Secondary | ICD-10-CM | POA: Diagnosis not present

## 2014-08-19 DIAGNOSIS — O9989 Other specified diseases and conditions complicating pregnancy, childbirth and the puerperium: Secondary | ICD-10-CM

## 2014-08-19 LAB — HIV ANTIBODY (ROUTINE TESTING W REFLEX): HIV 1&2 Ab, 4th Generation: NONREACTIVE

## 2014-08-19 LAB — CBC
HCT: 30.6 % — ABNORMAL LOW (ref 36.0–46.0)
Hemoglobin: 10.2 g/dL — ABNORMAL LOW (ref 12.0–15.0)
MCH: 34.8 pg — ABNORMAL HIGH (ref 26.0–34.0)
MCHC: 33.3 g/dL (ref 30.0–36.0)
MCV: 104.4 fL — AB (ref 78.0–100.0)
Platelets: 314 10*3/uL (ref 150–400)
RBC: 2.93 MIL/uL — AB (ref 3.87–5.11)
RDW: 12.7 % (ref 11.5–15.5)
WBC: 8.8 10*3/uL (ref 4.0–10.5)

## 2014-08-19 LAB — WET PREP, GENITAL
CLUE CELLS WET PREP: NONE SEEN
Trich, Wet Prep: NONE SEEN
Yeast Wet Prep HPF POC: NONE SEEN

## 2014-08-19 NOTE — Discharge Instructions (Signed)
Third Trimester of Pregnancy The third trimester is from week 29 through week 42, months 7 through 9. The third trimester is a time when the fetus is growing rapidly. At the end of the ninth month, the fetus is about 20 inches in length and weighs 6-10 pounds.  BODY CHANGES Your body goes through many changes during pregnancy. The changes vary from woman to woman.   Your weight will continue to increase. You can expect to gain 25-35 pounds (11-16 kg) by the end of the pregnancy.  You may begin to get stretch marks on your hips, abdomen, and breasts.  You may urinate more often because the fetus is moving lower into your pelvis and pressing on your bladder.  You may develop or continue to have heartburn as a result of your pregnancy.  You may develop constipation because certain hormones are causing the muscles that push waste through your intestines to slow down.  You may develop hemorrhoids or swollen, bulging veins (varicose veins).  You may have pelvic pain because of the weight gain and pregnancy hormones relaxing your joints between the bones in your pelvis. Backaches may result from overexertion of the muscles supporting your posture.  You may have changes in your hair. These can include thickening of your hair, rapid growth, and changes in texture. Some women also have hair loss during or after pregnancy, or hair that feels dry or thin. Your hair will most likely return to normal after your baby is born.  Your breasts will continue to grow and be tender. A yellow discharge may leak from your breasts called colostrum.  Your belly button may stick out.  You may feel short of breath because of your expanding uterus.  You may notice the fetus "dropping," or moving lower in your abdomen.  You may have a bloody mucus discharge. This usually occurs a few days to a week before labor begins.  Your cervix becomes thin and soft (effaced) near your due date. WHAT TO EXPECT AT YOUR PRENATAL  EXAMS  You will have prenatal exams every 2 weeks until week 36. Then, you will have weekly prenatal exams. During a routine prenatal visit:  You will be weighed to make sure you and the fetus are growing normally.  Your blood pressure is taken.  Your abdomen will be measured to track your baby's growth.  The fetal heartbeat will be listened to.  Any test results from the previous visit will be discussed.  You may have a cervical check near your due date to see if you have effaced. At around 36 weeks, your caregiver will check your cervix. At the same time, your caregiver will also perform a test on the secretions of the vaginal tissue. This test is to determine if a type of bacteria, Group B streptococcus, is present. Your caregiver will explain this further. Your caregiver may ask you:  What your birth plan is.  How you are feeling.  If you are feeling the baby move.  If you have had any abnormal symptoms, such as leaking fluid, bleeding, severe headaches, or abdominal cramping.  If you have any questions. Other tests or screenings that may be performed during your third trimester include:  Blood tests that check for low iron levels (anemia).  Fetal testing to check the health, activity level, and growth of the fetus. Testing is done if you have certain medical conditions or if there are problems during the pregnancy. FALSE LABOR You may feel small, irregular contractions that   eventually go away. These are called Braxton Hicks contractions, or false labor. Contractions may last for hours, days, or even weeks before true labor sets in. If contractions come at regular intervals, intensify, or become painful, it is best to be seen by your caregiver.  SIGNS OF LABOR   Menstrual-like cramps.  Contractions that are 5 minutes apart or less.  Contractions that start on the top of the uterus and spread down to the lower abdomen and back.  A sense of increased pelvic pressure or back  pain.  A watery or bloody mucus discharge that comes from the vagina. If you have any of these signs before the 37th week of pregnancy, call your caregiver right away. You need to go to the hospital to get checked immediately. HOME CARE INSTRUCTIONS   Avoid all smoking, herbs, alcohol, and unprescribed drugs. These chemicals affect the formation and growth of the baby.  Follow your caregiver's instructions regarding medicine use. There are medicines that are either safe or unsafe to take during pregnancy.  Exercise only as directed by your caregiver. Experiencing uterine cramps is a good sign to stop exercising.  Continue to eat regular, healthy meals.  Wear a good support bra for breast tenderness.  Do not use hot tubs, steam rooms, or saunas.  Wear your seat belt at all times when driving.  Avoid raw meat, uncooked cheese, cat litter boxes, and soil used by cats. These carry germs that can cause birth defects in the baby.  Take your prenatal vitamins.  Try taking a stool softener (if your caregiver approves) if you develop constipation. Eat more high-fiber foods, such as fresh vegetables or fruit and whole grains. Drink plenty of fluids to keep your urine clear or pale yellow.  Take warm sitz baths to soothe any pain or discomfort caused by hemorrhoids. Use hemorrhoid cream if your caregiver approves.  If you develop varicose veins, wear support hose. Elevate your feet for 15 minutes, 3-4 times a day. Limit salt in your diet.  Avoid heavy lifting, wear low heal shoes, and practice good posture.  Rest a lot with your legs elevated if you have leg cramps or low back pain.  Visit your dentist if you have not gone during your pregnancy. Use a soft toothbrush to brush your teeth and be gentle when you floss.  A sexual relationship may be continued unless your caregiver directs you otherwise.  Do not travel far distances unless it is absolutely necessary and only with the approval  of your caregiver.  Take prenatal classes to understand, practice, and ask questions about the labor and delivery.  Make a trial run to the hospital.  Pack your hospital bag.  Prepare the baby's nursery.  Continue to go to all your prenatal visits as directed by your caregiver. SEEK MEDICAL CARE IF:  You are unsure if you are in labor or if your water has broken.  You have dizziness.  You have mild pelvic cramps, pelvic pressure, or nagging pain in your abdominal area.  You have persistent nausea, vomiting, or diarrhea.  You have a bad smelling vaginal discharge.  You have pain with urination. SEEK IMMEDIATE MEDICAL CARE IF:   You have a fever.  You are leaking fluid from your vagina.  You have spotting or bleeding from your vagina.  You have severe abdominal cramping or pain.  You have rapid weight loss or gain.  You have shortness of breath with chest pain.  You notice sudden or extreme swelling   of your face, hands, ankles, feet, or legs.  You have not felt your baby move in over an hour.  You have severe headaches that do not go away with medicine.  You have vision changes. Document Released: 08/10/2001 Document Revised: 08/21/2013 Document Reviewed: 10/17/2012 Exit[employer] Patient Information 2015 [employer]. This information is not intended to replace advice given to you by your health care provider. Make sure you discuss any questions you have with your health care provider.  

## 2014-08-19 NOTE — MAU Provider Note (Signed)
History     CSN: [id number]  Arrival date and time: 08/18/14 2303   First Provider Initiated Contact with Patient 08/19/14 0041      Chief Complaint  Patient presents with  . Abdominal Pain  . Vaginal Bleeding   HPI  Carmen Noble is a 28 y.o. [phone] at 697w3d who presents today with spotting and abdominal pain. She states that the pain started around 1830, and she also had spotting around that time. She states that she has not needed to wear a pad. She states that she only saw the spotting with wiping. She denies any recent intercourse. She denies any complications with the pregnancy. She has had two vaginal deliveries at term. She states that the only complications with her previous pregnancies was elevated blood pressure at the end.   Past Medical History  Diagnosis Date  . Hypertension   . Migraine   . Depression     on zoloft    Past Surgical History  Procedure Laterality Date  . Hernia repair    . Appendectomy    . Ankle surgery    . Mouth surgery    . Hernia repair  2010  . Appendectomy  2008    Family History  Problem Relation Age of Onset  . Diabetes Father     History  Substance Use Topics  . Smoking status: Current Every Day Smoker -- 0.50 packs/day for 15 years    Types: Cigarettes  . Smokeless tobacco: Never Used     Comment: thinking about it  . Alcohol Use: No    Allergies:  Allergies  Allergen Reactions  . Bee Venom Anaphylaxis  . Penicillins Anaphylaxis    Prescriptions prior to admission  Medication Sig Dispense Refill Last Dose  . acetaminophen (TYLENOL) 325 MG tablet Take 650 mg by mouth every 6 (six) hours as needed for headache.   Taking  . albuterol (PROVENTIL HFA;VENTOLIN HFA) 108 (90 BASE) MCG/ACT inhaler Inhale 2 puffs into the lungs every 6 (six) hours as needed for wheezing or shortness of breath. 1 Inhaler 1 Taking  . butalbital-acetaminophen-caffeine (FIORICET) 50-325-40 MG per tablet Take 2 tablets by mouth every 6 (six)  hours as needed for headache. 40 tablet 2 Taking  . Prenat-FeFmCb-DSS-FA-DHA w/o A (CITRANATAL HARMONY) 27-1-260 MG CAPS Take 1 capsule by mouth daily before breakfast. 90 capsule 3 Taking  . sertraline (ZOLOFT) 50 MG tablet Take 1 tablet (50 mg total) by mouth daily. 30 tablet 11   . traMADol (ULTRAM) 50 MG tablet Take 1-2 tablets (50-100 mg total) by mouth every 6 (six) hours as needed for moderate pain. 40 tablet 2 Taking    ROS Physical Exam   Blood pressure 130/83, pulse 92, temperature 98.2 F (36.8 C), temperature source Oral, resp. rate 20, height 5\' 3"  (1.6 m), weight 71.668 kg (158 lb), last menstrual period 01/24/2014.  Physical Exam  Nursing note and vitals reviewed. Constitutional: She is oriented to person, place, and time. She appears well-developed and well-nourished. No distress.  Cardiovascular: Normal rate.   Respiratory: Effort normal.  GI: Soft. There is no tenderness. There is no rebound.  Genitourinary:  External: no lesion Vagina: small amount of white discharge. No blood in the vagina.  Cervix: pink, smooth, no CMT. Closed/thick/firm/high Uterus: AGA   Neurological: She is alert and oriented to person, place, and time.  Skin: Skin is warm and dry.  Psychiatric: She has a normal mood and affect.   FHT 130, moderate with 10x10 (appropriate  for GA) Toco: no UCs MAU Course  Procedures  Results for orders placed or performed during the hospital encounter of 08/18/14 (from the past 24 hour(s))  Urinalysis, Routine w reflex microscopic     Status: None   Collection Time: 08/18/14 11:22 PM  Result Value Ref Range   Color, Urine YELLOW YELLOW   APPearance CLEAR CLEAR   Specific Gravity, Urine 1.010 1.005 - 1.030   pH 6.5 5.0 - 8.0   Glucose, UA NEGATIVE NEGATIVE mg/dL   Hgb urine dipstick NEGATIVE NEGATIVE   Bilirubin Urine NEGATIVE NEGATIVE   Ketones, ur NEGATIVE NEGATIVE mg/dL   Protein, ur NEGATIVE NEGATIVE mg/dL   Urobilinogen, UA 0.2 0.0 - 1.0 mg/dL    Nitrite NEGATIVE NEGATIVE   Leukocytes, UA NEGATIVE NEGATIVE  CBC     Status: Abnormal   Collection Time: 08/19/14 12:55 AM  Result Value Ref Range   WBC 8.8 4.0 - 10.5 K/uL   RBC 2.93 (L) 3.87 - 5.11 MIL/uL   Hemoglobin 10.2 (L) 12.0 - 15.0 g/dL   HCT 96.030.6 (L) 45.436.0 - 09.846.0 %   MCV 104.4 (H) 78.0 - 100.0 fL   MCH 34.8 (H) 26.0 - 34.0 pg   MCHC 33.3 30.0 - 36.0 g/dL   RDW 11.912.7 14.711.5 - 82.915.5 %   Platelets 314 150 - 400 K/uL  Wet prep, genital     Status: Abnormal   Collection Time: 08/19/14  1:00 AM  Result Value Ref Range   Yeast Wet Prep HPF POC NONE SEEN NONE SEEN   Trich, Wet Prep NONE SEEN NONE SEEN   Clue Cells Wet Prep HPF POC NONE SEEN NONE SEEN   WBC, Wet Prep HPF POC RARE (A) NONE SEEN     Assessment and Plan   1. Abdominal pain in pregnancy    PTL precautions Fetal kick counts Return to MAU as needed  Follow-up Information    Follow up with Brock BadHARPER,CHARLES A, MD.   Specialty:  Obstetrics and Gynecology   Why:  As scheduled   Contact information:   [address] [place] [address] [phone]        Tawnya CrookHogan, Heather Donovan 08/19/2014, 12:49 AM

## 2014-08-20 ENCOUNTER — Telehealth: Payer: Self-pay | Admitting: *Deleted

## 2014-08-20 LAB — GC/CHLAMYDIA PROBE AMP
CT PROBE, AMP APTIMA: NEGATIVE
GC Probe RNA: NEGATIVE

## 2014-08-20 NOTE — Telephone Encounter (Signed)
Patient called stating she is having some pain in her abdomen that  She discussed with Dr.Harper and states she had discussed a refill on the Tramadol.  Patient is requesting to have a refill on her Tramadol.

## 2014-08-22 NOTE — Telephone Encounter (Signed)
Denied.

## 2014-08-26 ENCOUNTER — Encounter: Payer: Self-pay | Admitting: Obstetrics

## 2014-08-26 ENCOUNTER — Ambulatory Visit (INDEPENDENT_AMBULATORY_CARE_PROVIDER_SITE_OTHER): Payer: Medicaid Other | Admitting: Obstetrics

## 2014-08-26 VITALS — BP 131/77 | HR 74 | Temp 98.3°F | Wt 153.0 lb

## 2014-08-26 DIAGNOSIS — R52 Pain, unspecified: Secondary | ICD-10-CM

## 2014-08-26 DIAGNOSIS — Z3483 Encounter for supervision of other normal pregnancy, third trimester: Secondary | ICD-10-CM

## 2014-08-26 LAB — POCT URINALYSIS DIPSTICK
BILIRUBIN UA: NEGATIVE
Blood, UA: NEGATIVE
Glucose, UA: NEGATIVE
NITRITE UA: NEGATIVE
Spec Grav, UA: 1.02
Urobilinogen, UA: NEGATIVE
pH, UA: 5.5

## 2014-08-26 MED ORDER — TRAMADOL HCL 50 MG PO TABS: 50.0000 mg | ORAL_TABLET | Freq: Four times a day (QID) | ORAL | Status: DC | PRN

## 2014-08-26 NOTE — Progress Notes (Signed)
Subjective:    Carmen Noble is a 28 y.o. female being seen today for her obstetrical visit. She is at 4315w3d gestation. Patient reports no complaints. Fetal movement: normal.  Problem List Items Addressed This Visit    None    Visit Diagnoses    Encounter for supervision of other normal pregnancy in third trimester    -  Primary    Relevant Orders       POCT urinalysis dipstick (Completed)       Culture, OB Urine    Pain aggravated by activities of daily living        Relevant Medications       traMADol (ULTRAM) 50 MG tablet      Patient Active Problem List   Diagnosis Date Noted  . Pregnancy with abdominal pain of left upper quadrant, antepartum   . [redacted] weeks gestation of pregnancy   . Chlamydial cervicitis 04/04/2014  . Dysmenorrhea 06/27/2013  . Other and unspecified ovarian cyst 06/27/2013   Objective:    BP 131/77 mmHg  Pulse 74  Temp(Src) 98.3 F (36.8 C)  Wt 153 lb (69.4 kg)  LMP 01/24/2014 FHT:  150 BPM  Uterine Size: size equals dates  Presentation: unsure     Assessment:    Pregnancy @ 4415w3d weeks   Plan:     labs reviewed, problem list updated Consent signed. GBS sent TDAP offered  Rhogam given for RH negative Pediatrician: discussed. Infant feeding: plans to breastfeed. Maternity leave: not discussed. Cigarette smoking: smokes 0.5 PPD. Orders Placed This Encounter  Procedures  . Culture, OB Urine  . POCT urinalysis dipstick   Meds ordered this encounter  Medications  . traMADol (ULTRAM) 50 MG tablet    Sig: Take 1-2 tablets (50-100 mg total) by mouth every 6 (six) hours as needed for moderate pain.    Dispense:  40 tablet    Refill:  2   Follow up in 2 Weeks.

## 2014-08-28 LAB — CULTURE, OB URINE: Colony Count: >=100000

## 2014-08-28 NOTE — Telephone Encounter (Signed)
Patient in office 08-26-14

## 2014-09-04 NOTE — Telephone Encounter (Signed)
Pt has been seen in office since call placed. Call to be closed.

## 2014-09-06 ENCOUNTER — Encounter (HOSPITAL_COMMUNITY): Payer: Self-pay

## 2014-09-06 ENCOUNTER — Inpatient Hospital Stay (HOSPITAL_COMMUNITY)
Admission: AD | Admit: 2014-09-06 | Discharge: 2014-09-06 | Disposition: A | Discharge status: Home / Self Care | Payer: Medicaid Other | Source: Ambulatory Visit | Attending: Obstetrics | Admitting: Obstetrics

## 2014-09-06 DIAGNOSIS — O99333 Smoking (tobacco) complicating pregnancy, third trimester: Secondary | ICD-10-CM | POA: Diagnosis not present

## 2014-09-06 DIAGNOSIS — O26899 Other specified pregnancy related conditions, unspecified trimester: Secondary | ICD-10-CM

## 2014-09-06 DIAGNOSIS — O9989 Other specified diseases and conditions complicating pregnancy, childbirth and the puerperium: Secondary | ICD-10-CM | POA: Diagnosis not present

## 2014-09-06 DIAGNOSIS — F17210 Nicotine dependence, cigarettes, uncomplicated: Secondary | ICD-10-CM | POA: Diagnosis not present

## 2014-09-06 DIAGNOSIS — R109 Unspecified abdominal pain: Secondary | ICD-10-CM | POA: Insufficient documentation

## 2014-09-06 DIAGNOSIS — Z3A32 32 weeks gestation of pregnancy: Secondary | ICD-10-CM | POA: Insufficient documentation

## 2014-09-06 LAB — URINALYSIS, ROUTINE W REFLEX MICROSCOPIC
BILIRUBIN URINE: NEGATIVE
Glucose, UA: NEGATIVE mg/dL
Hgb urine dipstick: NEGATIVE
Ketones, ur: 15 mg/dL — AB
LEUKOCYTES UA: NEGATIVE
Nitrite: NEGATIVE
PH: 6.5 (ref 5.0–8.0)
PROTEIN: NEGATIVE mg/dL
SPECIFIC GRAVITY, URINE: 1.015 (ref 1.005–1.030)
UROBILINOGEN UA: 0.2 mg/dL (ref 0.0–1.0)

## 2014-09-06 LAB — WET PREP, GENITAL
CLUE CELLS WET PREP: NONE SEEN
Trich, Wet Prep: NONE SEEN
Yeast Wet Prep HPF POC: NONE SEEN

## 2014-09-06 NOTE — MAU Provider Note (Signed)
Chief Complaint:  Abdominal Pain and Back Pain   First Provider Initiated Contact with Patient 09/06/14 1240      HPI: Carmen Noble is a 29 y.o. [id number] at 63w0dwho presents to maternity admissions reporting abdominal cramping and back pain intermittently x 2-3 days.  She reports good fetal movement, denies LOF, vaginal bleeding, vaginal itching/burning, urinary symptoms, h/a, dizziness, n/v, or fever/chills.    Past Medical History: Past Medical History  Diagnosis Date  . Hypertension   . Migraine   . Depression     on zoloft    Past obstetric history: OB History  Gravida Para Term Preterm AB SAB TAB Ectopic Multiple Living  # Outcome Date GA Lbr Len/2nd Weight Sex Delivery Anes PTL Lv  4 Current           3 Term 09/23/08    F Jarrett Ables Y  2 Term 09/14/04    Kateri Plummer Y  1 TAB 1998              Past Surgical History: Past Surgical History  Procedure Laterality Date  . Hernia repair    . Appendectomy    . Ankle surgery    . Mouth surgery    . Hernia repair  2010  . Appendectomy  2008    Family History: Family History  Problem Relation Age of Onset  . Diabetes Father     Social History: History  Substance Use Topics  . Smoking status: Current Every Day Smoker -- 0.50 packs/day for 15 years    Types: Cigarettes  . Smokeless tobacco: Never Used     Comment: thinking about it  . Alcohol Use: No    Allergies:  Allergies  Allergen Reactions  . Bee Venom Anaphylaxis  . Penicillins Anaphylaxis    Meds:  Prescriptions prior to admission  Medication Sig Dispense Refill Last Dose  . butalbital-acetaminophen-caffeine (FIORICET) 50-325-40 MG per tablet Take 2 tablets by mouth every 6 (six) hours as needed for headache. 40 tablet 2 Past Week at Unknown time  . Prenat-FeFmCb-DSS-FA-DHA w/o A (CITRANATAL HARMONY) 27-1-260 MG CAPS Take 1 capsule by mouth daily before breakfast. 90 capsule 3 09/05/2014 at Unknown time  . sertraline  (ZOLOFT) 50 MG tablet Take 1 tablet (50 mg total) by mouth daily. 30 tablet 11 09/05/2014 at Unknown time  . traMADol (ULTRAM) 50 MG tablet Take 1-2 tablets (50-100 mg total) by mouth every 6 (six) hours as needed for moderate pain. 40 tablet 2 09/05/2014 at Unknown time  . acetaminophen (TYLENOL) 325 MG tablet Take 650 mg by mouth every 6 (six) hours as needed for headache.   prn  . albuterol (PROVENTIL HFA;VENTOLIN HFA) 108 (90 BASE) MCG/ACT inhaler Inhale 2 puffs into the lungs every 6 (six) hours as needed for wheezing or shortness of breath. 1 Inhaler 1 prn    ROS: Pertinent findings in history of present illness.  Physical Exam  Blood pressure 110/68, pulse 114, temperature 97.6 F (36.4 C), temperature source Oral, resp. rate 18, height  (1.575 m), weight 69.003 kg (152 lb 2 oz), last menstrual period 01/24/2014, SpO2 97 %. GENERAL: Well-developed, well-nourished female in no acute distress.  HEENT: normocephalic HEART: normal rate RESP: normal effort ABDOMEN: Soft, non-tender, gravid appropriate for gestational age EXTREMITIES: Nontender, no edema NEURO: alert and oriented Pelvic exam: Cervix pink, visually closed, without lesion, scant white creamy discharge, vaginal walls  and external genitalia normal  Dilation: Closed Effacement (%): Thick Exam by:: LLeftwich-Kirby, CNM  FHT:  Baseline 145 , moderate variability, accelerations present, no decelerations Contractions: None on toco or to palpation   Labs: Results for orders placed or performed during the hospital encounter of 09/06/14 (from the past 24 hour(s))  Urinalysis, Routine w reflex microscopic     Status: Abnormal   Collection Time: 09/06/14 11:43 AM  Result Value Ref Range   Color, Urine YELLOW YELLOW   APPearance CLEAR CLEAR   Specific Gravity, Urine 1.015 1.005 - 1.030   pH 6.5 5.0 - 8.0   Glucose, UA NEGATIVE NEGATIVE mg/dL   Hgb urine dipstick NEGATIVE NEGATIVE   Bilirubin Urine NEGATIVE NEGATIVE    Ketones, ur 15 (A) NEGATIVE mg/dL   Protein, ur NEGATIVE NEGATIVE mg/dL   Urobilinogen, UA 0.2 0.0 - 1.0 mg/dL   Nitrite NEGATIVE NEGATIVE   Leukocytes, UA NEGATIVE NEGATIVE  Wet prep, genital     Status: Abnormal   Collection Time: 09/06/14 12:58 PM  Result Value Ref Range   Yeast Wet Prep HPF POC NONE SEEN NONE SEEN   Trich, Wet Prep NONE SEEN NONE SEEN   Clue Cells Wet Prep HPF POC NONE SEEN NONE SEEN   WBC, Wet Prep HPF POC FEW (A) NONE SEEN    Assessment: 1. Abdominal pain affecting pregnancy     Plan: Discharge home PTL precautions and fetal kick counts Increase PO fluids  Follow-up Information    Follow up with HARPER,CHARLES A, MD.   Specialty:  Obstetrics and Gynecology   Why:  As scheduled   Contact information:   [address] [place] [id number] [phone]       [facility].   Why:  As needed for emergencies   Contact information:   [address] [id number] mc [place] [id number] [phone]       Medication List    TAKE these medications        acetaminophen 325 MG tablet  Commonly known as:  TYLENOL  Take 650 mg by mouth every 6 (six) hours as needed for headache.     albuterol 108 (90 BASE) MCG/ACT inhaler  Commonly known as:  PROVENTIL HFA;VENTOLIN HFA  Inhale 2 puffs into the lungs every 6 (six) hours as needed for wheezing or shortness of breath.     butalbital-acetaminophen-caffeine 50-325-40 MG per tablet  Commonly known as:  FIORICET  Take 2 tablets by mouth every 6 (six) hours as needed for headache.     CITRANATAL HARMONY 27-1-260 MG Caps  Take 1 capsule by mouth daily before breakfast.     sertraline 50 MG tablet  Commonly known as:  ZOLOFT  Take 1 tablet (50 mg total) by mouth daily.     traMADol 50 MG tablet  Commonly known as:  ULTRAM  Take 1-2 tablets (50-100 mg total) by mouth every 6 (six) hours as needed for moderate  pain.        Sharen CounterLisa Leftwich-Kirby Certified Nurse-Midwife 09/06/2014 2:18 PM

## 2014-09-06 NOTE — MAU Note (Signed)
Pt states here for pain in lower abdomen and low back that began around 1330 yesterday afternoon. No bleeding. Has increased vaginal discharge that has a yellow tinge. Denies uti s/s.

## 2014-09-06 NOTE — Discharge Instructions (Signed)

## 2014-09-07 LAB — GC/CHLAMYDIA PROBE AMP
CT Probe RNA: NEGATIVE
GC PROBE AMP APTIMA: NEGATIVE

## 2014-09-10 ENCOUNTER — Ambulatory Visit (INDEPENDENT_AMBULATORY_CARE_PROVIDER_SITE_OTHER): Payer: Medicaid Other | Admitting: Obstetrics

## 2014-09-10 VITALS — BP 117/79 | HR 88 | Temp 98.0°F | Wt 151.0 lb

## 2014-09-10 DIAGNOSIS — Z3483 Encounter for supervision of other normal pregnancy, third trimester: Secondary | ICD-10-CM

## 2014-09-10 LAB — POCT URINALYSIS DIPSTICK
BILIRUBIN UA: NEGATIVE
Blood, UA: NEGATIVE
GLUCOSE UA: NEGATIVE
Ketones, UA: NEGATIVE
Leukocytes, UA: NEGATIVE
NITRITE UA: NEGATIVE
Protein, UA: NEGATIVE
Spec Grav, UA: 1.015
UROBILINOGEN UA: NEGATIVE
pH, UA: 6

## 2014-09-11 ENCOUNTER — Other Ambulatory Visit: Payer: Self-pay | Admitting: Obstetrics

## 2014-09-11 ENCOUNTER — Encounter: Payer: Self-pay | Admitting: Obstetrics

## 2014-09-11 NOTE — Progress Notes (Signed)
Subjective:    Carmen Noble is a 29 y.o. female being seen today for her obstetrical visit. She is at 5941w5d gestation. Patient reports no complaints. Fetal movement: normal.  Problem List Items Addressed This Visit    None    Visit Diagnoses    Encounter for supervision of other normal pregnancy in third trimester    -  Primary    Relevant Orders    POCT urinalysis dipstick (Completed)      Patient Active Problem List   Diagnosis Date Noted  . Pregnancy with abdominal pain of left upper quadrant, antepartum   . [redacted] weeks gestation of pregnancy   . Chlamydial cervicitis 04/04/2014  . Dysmenorrhea 06/27/2013  . Other and unspecified ovarian cyst 06/27/2013   Objective:    BP 117/79 mmHg  Pulse 88  Temp(Src) 98 F (36.7 C)  Wt 151 lb (68.493 kg)  LMP 01/24/2014 FHT:  150 BPM  Uterine Size: size equals dates  Presentation: unsure     Assessment:    Pregnancy @ 441w5d weeks   Plan:     labs reviewed, problem list updated Consent signed. GBS sent TDAP offered  Rhogam given for RH negative Pediatrician: discussed. Infant feeding: plans to breastfeed. Maternity leave: discussed. Cigarette smoking: smokes 0.5 PPD. Orders Placed This Encounter  Procedures  . POCT urinalysis dipstick   No orders of the defined types were placed in this encounter.   Follow up in 2 Weeks.

## 2014-09-12 ENCOUNTER — Ambulatory Visit: Payer: Medicaid Other | Admitting: Neurology

## 2014-09-13 ENCOUNTER — Telehealth: Payer: Self-pay | Admitting: *Deleted

## 2014-09-13 NOTE — Telephone Encounter (Signed)
Per Dr. Clearance CootsHarper Prescription has been denied.

## 2014-09-13 NOTE — Telephone Encounter (Signed)
Patient advised that Dr. Clearance CootsHarper is not going to refill this prescription. Patient verbalized understanding.

## 2014-09-13 NOTE — Telephone Encounter (Signed)
Patient has contacted the office requesting a refill on Tramadol. Patient states she is having increasing back pain. Patient was given a prescription on 08-26-14 for Tramadol #40 with 2 additional refills.  Advised patient would apeak with Dr. Clearance CootsHarper and call back with his recommendation.

## 2014-09-24 ENCOUNTER — Ambulatory Visit (INDEPENDENT_AMBULATORY_CARE_PROVIDER_SITE_OTHER): Payer: Medicaid Other | Admitting: Obstetrics

## 2014-09-24 VITALS — BP 140/77 | HR 107 | Temp 97.3°F | Wt 157.0 lb

## 2014-09-24 DIAGNOSIS — R52 Pain, unspecified: Secondary | ICD-10-CM

## 2014-09-24 DIAGNOSIS — Z3483 Encounter for supervision of other normal pregnancy, third trimester: Secondary | ICD-10-CM

## 2014-09-24 DIAGNOSIS — G44019 Episodic cluster headache, not intractable: Secondary | ICD-10-CM

## 2014-09-24 LAB — POCT URINALYSIS DIPSTICK
Bilirubin, UA: NEGATIVE
Blood, UA: NEGATIVE
GLUCOSE UA: NEGATIVE
KETONES UA: NEGATIVE
Leukocytes, UA: NEGATIVE
Nitrite, UA: NEGATIVE
Spec Grav, UA: 1.015
Urobilinogen, UA: NEGATIVE
pH, UA: 5

## 2014-09-24 MED ORDER — BUTALBITAL-APAP-CAFFEINE 50-325-40 MG PO TABS: 2.0000 | ORAL_TABLET | Freq: Four times a day (QID) | ORAL | Status: DC | PRN

## 2014-09-24 MED ORDER — TRAMADOL HCL 50 MG PO TABS: 50.0000 mg | ORAL_TABLET | Freq: Four times a day (QID) | ORAL | Status: DC | PRN

## 2014-09-30 ENCOUNTER — Encounter: Payer: Self-pay | Admitting: Obstetrics

## 2014-09-30 ENCOUNTER — Ambulatory Visit (INDEPENDENT_AMBULATORY_CARE_PROVIDER_SITE_OTHER): Payer: Medicaid Other | Admitting: Obstetrics

## 2014-09-30 VITALS — BP 131/87 | HR 91 | Temp 98.1°F | Wt 158.0 lb

## 2014-09-30 DIAGNOSIS — Z3483 Encounter for supervision of other normal pregnancy, third trimester: Secondary | ICD-10-CM

## 2014-09-30 LAB — POCT URINALYSIS DIPSTICK
Bilirubin, UA: NEGATIVE
Glucose, UA: NEGATIVE
KETONES UA: NEGATIVE
Leukocytes, UA: NEGATIVE
NITRITE UA: NEGATIVE
RBC UA: NEGATIVE
SPEC GRAV UA: 1.015
Urobilinogen, UA: NEGATIVE
pH, UA: 6

## 2014-09-30 NOTE — Progress Notes (Signed)
Subjective:    Carmen Noble is a 29 y.o. female being seen today for her obstetrical visit. She is at 7140w3d gestation. Patient reports no complaints. Fetal movement: normal.  Problem List Items Addressed This Visit    None    Visit Diagnoses    Encounter for supervision of other normal pregnancy in third trimester    -  Primary    Relevant Orders    POCT urinalysis dipstick (Completed)    Episodic cluster headache, not intractable        Relevant Medications    butalbital-acetaminophen-caffeine (FIORICET) 50-325-40 MG per tablet    traMADol (ULTRAM) 50 MG tablet    Pain aggravated by activities of daily living        Relevant Medications    traMADol (ULTRAM) 50 MG tablet      Patient Active Problem List   Diagnosis Date Noted  . Pregnancy with abdominal pain of left upper quadrant, antepartum   . [redacted] weeks gestation of pregnancy   . Chlamydial cervicitis 04/04/2014  . Dysmenorrhea 06/27/2013  . Other and unspecified ovarian cyst 06/27/2013   Objective:    BP 140/77 mmHg  Pulse 107  Temp(Src) 97.3 F (36.3 C)  Wt 157 lb (71.215 kg)  LMP 01/24/2014 FHT:  150 BPM  Uterine Size: size equals dates  Presentation: unsure     Assessment:    Pregnancy @ 1740w3d weeks   Plan:     labs reviewed, problem list updated Consent signed. GBS sent TDAP offered  Rhogam given for RH negative Pediatrician: discussed. Infant feeding: plans to breastfeed. Maternity leave: discussed. Cigarette smoking: smokes 0.5 PPD. Orders Placed This Encounter  Procedures  . POCT urinalysis dipstick   Meds ordered this encounter  Medications  . butalbital-acetaminophen-caffeine (FIORICET) 50-325-40 MG per tablet    Sig: Take 2 tablets by mouth every 6 (six) hours as needed for headache.    Dispense:  40 tablet    Refill:  2  . traMADol (ULTRAM) 50 MG tablet    Sig: Take 1-2 tablets (50-100 mg total) by mouth every 6 (six) hours as needed for moderate pain.    Dispense:  40 tablet   Refill:  2   Follow up in 1 Week.

## 2014-09-30 NOTE — Progress Notes (Signed)
Subjective:    Carmen Noble is a 29 y.o. female being seen today for her obstetrical visit. She is at 5555w3d gestation. Patient reports sore throat. Fetal movement: normal.  Problem List Items Addressed This Visit    None    Visit Diagnoses    Encounter for supervision of other normal pregnancy in third trimester    -  Primary    Relevant Orders    Culture, Grp B Strep w/Rflx Suscept    POCT urinalysis dipstick (Completed)      Patient Active Problem List   Diagnosis Date Noted  . Pregnancy with abdominal pain of left upper quadrant, antepartum   . [redacted] weeks gestation of pregnancy   . Chlamydial cervicitis 04/04/2014  . Dysmenorrhea 06/27/2013  . Other and unspecified ovarian cyst 06/27/2013   Objective:    BP 131/87 mmHg  Pulse 91  Temp(Src) 98.1 F (36.7 C)  Wt 158 lb (71.668 kg)  LMP 01/24/2014 FHT:  150 BPM  Uterine Size: size equals dates  Presentation: cephalic     Assessment:    Pregnancy @ 3855w3d weeks   Plan:     labs reviewed, problem list updated Consent signed. GBS sent TDAP offered  Rhogam given for RH negative Pediatrician: discussed. Infant feeding: plans to breastfeed. Maternity leave: discussed. Cigarette smoking: smokes 0.5 PPD. Orders Placed This Encounter  Procedures  . Culture, Grp B Strep w/Rflx Suscept  . POCT urinalysis dipstick   No orders of the defined types were placed in this encounter.   Follow up in 1 Week.

## 2014-10-02 LAB — CULTURE, STREPTOCOCCUS GRP B W/SUSCEPT

## 2014-10-07 ENCOUNTER — Ambulatory Visit: Payer: Medicaid Other | Admitting: Neurology

## 2014-10-08 ENCOUNTER — Encounter: Payer: Medicaid Other | Admitting: Obstetrics

## 2014-10-09 ENCOUNTER — Telehealth: Payer: Self-pay | Admitting: Neurology

## 2014-10-09 ENCOUNTER — Encounter: Payer: Self-pay | Admitting: Neurology

## 2014-10-09 NOTE — Telephone Encounter (Signed)
Pt no showed NP appt w/ Dr. Everlena CooperJaffe 10/07/14. Referring provider's office notified via EPIC referral. No show letter + policy mailed to pt / Sherri S.

## 2014-10-09 NOTE — Telephone Encounter (Signed)
No show letter mailed for 10/07/14 appt w/ Dr. Everlena CooperJaffe

## 2014-10-15 ENCOUNTER — Encounter: Payer: Self-pay | Admitting: Obstetrics

## 2014-10-15 ENCOUNTER — Ambulatory Visit (INDEPENDENT_AMBULATORY_CARE_PROVIDER_SITE_OTHER): Payer: Medicaid Other | Admitting: Obstetrics

## 2014-10-15 VITALS — BP 130/82 | HR 102 | Temp 98.3°F | Wt 156.0 lb

## 2014-10-15 DIAGNOSIS — Z3483 Encounter for supervision of other normal pregnancy, third trimester: Secondary | ICD-10-CM

## 2014-10-15 DIAGNOSIS — R52 Pain, unspecified: Secondary | ICD-10-CM

## 2014-10-15 LAB — POCT URINALYSIS DIPSTICK
Bilirubin, UA: NEGATIVE
GLUCOSE UA: NEGATIVE
Ketones, UA: NEGATIVE
Leukocytes, UA: NEGATIVE
Nitrite, UA: NEGATIVE
PH UA: 5
RBC UA: NEGATIVE
SPEC GRAV UA: 1.015
Urobilinogen, UA: NEGATIVE

## 2014-10-15 MED ORDER — OXYCODONE HCL 10 MG PO TABS: 10.0000 mg | ORAL_TABLET | Freq: Four times a day (QID) | ORAL | Status: DC | PRN

## 2014-10-15 NOTE — Progress Notes (Signed)
Subjective:    Carmen Noble is a 29 y.o. female being seen today for her obstetrical visit. She is at 6985w4d gestation. Patient reports backache and pain radiating down legs making it difficult to walk. Fetal movement: normal.  Problem List Items Addressed This Visit    None    Visit Diagnoses    Encounter for supervision of other normal pregnancy in third trimester    -  Primary    Relevant Orders    POCT urinalysis dipstick (Completed)    Pain aggravated by activities of daily living        Relevant Medications    Oxycodone HCl 10 MG TABS      Patient Active Problem List   Diagnosis Date Noted  . Pregnancy with abdominal pain of left upper quadrant, antepartum   . [redacted] weeks gestation of pregnancy   . Chlamydial cervicitis 04/04/2014  . Dysmenorrhea 06/27/2013  . Other and unspecified ovarian cyst 06/27/2013    Objective:    BP 137/96 mmHg  Pulse 102  Temp(Src) 98.3 F (36.8 C)  Wt 156 lb (70.761 kg)  LMP 01/24/2014 FHT: 150 BPM  Uterine Size: size equals dates  Presentations: unsure  Pelvic Exam: Deferred    Assessment:    Pregnancy @ 3685w4d weeks    Severe pelvic MSK pain in joint  Mildly elevated BP probably related to pain.  Plan:   Plans for delivery: Vaginal anticipated; labs reviewed; problem list updated Counseling: Consent signed. Infant feeding: plans to breastfeed. Cigarette smoking: smokes 0.5 PPD. L&D discussion: symptoms of labor, discussed when to call, discussed what number to call, anesthetic/analgesic options reviewed and delivering clinician:  plans Physician. Postpartum supports and preparation: circumcision discussed and contraception plans discussed.  Oxycodone and rest Rx for pain and preeclampsia precautions given.  Follow up in 1 Week.

## 2014-10-23 ENCOUNTER — Encounter: Payer: Medicaid Other | Admitting: Obstetrics

## 2014-10-27 ENCOUNTER — Encounter: Payer: Self-pay | Admitting: *Deleted

## 2014-11-11 ENCOUNTER — Ambulatory Visit: Payer: Medicaid Other | Admitting: Obstetrics

## 2014-11-12 ENCOUNTER — Ambulatory Visit: Payer: Medicaid Other | Admitting: Obstetrics

## 2014-11-18 ENCOUNTER — Ambulatory Visit: Payer: Medicaid Other | Admitting: Obstetrics

## 2014-11-21 ENCOUNTER — Encounter: Payer: Self-pay | Admitting: Obstetrics

## 2014-11-21 ENCOUNTER — Ambulatory Visit (INDEPENDENT_AMBULATORY_CARE_PROVIDER_SITE_OTHER): Payer: Medicaid Other | Admitting: Obstetrics

## 2014-11-21 DIAGNOSIS — Z30014 Encounter for initial prescription of intrauterine contraceptive device: Secondary | ICD-10-CM

## 2014-11-21 DIAGNOSIS — R52 Pain, unspecified: Secondary | ICD-10-CM

## 2014-11-21 MED ORDER — OXYCODONE HCL 10 MG PO TABS: 10.0000 mg | ORAL_TABLET | Freq: Four times a day (QID) | ORAL | Status: DC | PRN

## 2014-11-21 NOTE — Progress Notes (Signed)
Subjective:     Carmen Noble is a 29 y.o. female who presents for a postpartum visit. She is 4 weeks postpartum following a spontaneous vaginal delivery. I have fully reviewed the prenatal and intrapartum course. The delivery was at 38 gestational weeks. Outcome: spontaneous vaginal delivery. Anesthesia: none. Postpartum course has been normal. Baby's course has been normal. Baby is feeding by breast. Bleeding thin lochia. Bowel function is normal. Bladder function is normal. Patient is not sexually active. Contraception method is abstinence. Postpartum depression screening: negative.  Tobacco, alcohol and substance abuse history reviewed.  Adult immunizations reviewed including TDAP, rubella and varicella.  The following portions of the patient's history were reviewed and updated as appropriate: allergies, current medications, past family history, past medical history, past social history, past surgical history and problem list.  Review of Systems A comprehensive review of systems was negative.   Objective:    BP 138/88 mmHg  Pulse 103  Temp(Src) 99.2 F (37.3 C)  Ht 5\' 2"  (1.575 m)  Wt 140 lb (63.504 kg)  BMI 25.60 kg/m2  LMP 01/24/2014  Breastfeeding? Unknown     Assessment:    Postpartum, 2 weeks.  Doing well.  Contraceptive counseling.  Wants ParaGuard IUD.   Plan:    1. Contraception: IUD 2. ParaGuard IUD Rx 3. Follow up in: 2 weeks or as needed.   Healthy lifestyle practices reviewed

## 2014-11-27 ENCOUNTER — Other Ambulatory Visit: Payer: Self-pay | Admitting: *Deleted

## 2014-11-27 ENCOUNTER — Telehealth: Payer: Self-pay | Admitting: *Deleted

## 2014-11-27 DIAGNOSIS — Z30013 Encounter for initial prescription of injectable contraceptive: Secondary | ICD-10-CM

## 2014-11-27 MED ORDER — MEDROXYPROGESTERONE ACETATE 150 MG/ML IM SUSP: 150.0000 mg | INTRAMUSCULAR | Status: DC

## 2014-11-27 NOTE — Telephone Encounter (Signed)
Patient states she delivered 5 weeks ago. She has started bleeding heavily and is saturating a pad every 2 hours. Patient states she is cramping and having back pain. 3:00 After consulting with Dr Clearance CootsHarper- thinks probable cycle advise to take round the clock Ibuprofen every 6-8 hours. Attempted to call patient - got busy signal.

## 2014-11-27 NOTE — Telephone Encounter (Signed)
Spoke to patient- she is on her 3rd day of bleeding. She has decided she wants to use Depo instead of the IUD. That is OK per Dr Clearance CootsHarper and Rx will be sent to pharmacy. Patient to come on Friday for injection.

## 2014-11-29 ENCOUNTER — Ambulatory Visit: Payer: Medicaid Other

## 2014-12-02 ENCOUNTER — Ambulatory Visit: Payer: Medicaid Other

## 2014-12-09 ENCOUNTER — Ambulatory Visit (INDEPENDENT_AMBULATORY_CARE_PROVIDER_SITE_OTHER): Payer: Medicaid Other | Admitting: Obstetrics

## 2014-12-09 ENCOUNTER — Encounter: Payer: Self-pay | Admitting: Obstetrics

## 2014-12-09 DIAGNOSIS — N946 Dysmenorrhea, unspecified: Secondary | ICD-10-CM

## 2014-12-09 DIAGNOSIS — K5909 Other constipation: Secondary | ICD-10-CM

## 2014-12-09 DIAGNOSIS — Z30013 Encounter for initial prescription of injectable contraceptive: Secondary | ICD-10-CM

## 2014-12-09 DIAGNOSIS — K5904 Chronic idiopathic constipation: Secondary | ICD-10-CM

## 2014-12-09 DIAGNOSIS — D509 Iron deficiency anemia, unspecified: Secondary | ICD-10-CM

## 2014-12-09 MED ORDER — POLYETHYLENE GLYCOL 3350 17 G PO PACK: 17.0000 g | PACK | Freq: Every evening | ORAL | Status: DC | PRN

## 2014-12-09 MED ORDER — DOCUSATE SODIUM 100 MG PO CAPS: 100.0000 mg | ORAL_CAPSULE | Freq: Two times a day (BID) | ORAL | Status: DC

## 2014-12-09 MED ORDER — FUSION PLUS PO CAPS: 1.0000 | ORAL_CAPSULE | Freq: Every day | ORAL | Status: DC

## 2014-12-09 MED ORDER — HYDROCODONE-IBUPROFEN 7.5-200 MG PO TABS: 1.0000 | ORAL_TABLET | Freq: Three times a day (TID) | ORAL | Status: DC | PRN

## 2014-12-09 NOTE — Progress Notes (Signed)
Subjective:     Carmen Noble is a 29 y.o. female who presents for a postpartum visit. She is 7 weeks postpartum following a spontaneous vaginal delivery. I have fully reviewed the prenatal and intrapartum course. The delivery was at 38 gestational weeks. Outcome: spontaneous vaginal delivery. Anesthesia: IV sedation. Postpartum course has been normal. Baby's course has been normal. Baby is feeding by breast. Bleeding no bleeding. Bowel function is abnormal: constipation. Bladder function is normal. Patient is not sexually active. Contraception method is Depo-Provera injections. Postpartum depression screening: negative.  Tobacco, alcohol and substance abuse history reviewed.  Adult immunizations reviewed including TDAP, rubella and varicella.  The following portions of the patient's history were reviewed and updated as appropriate: allergies, current medications, past family history, past medical history, past social history, past surgical history and problem list.  Review of Systems A comprehensive review of systems was negative.   Objective:    BP 128/85 mmHg  Pulse 91  Temp(Src) 98.5 F (36.9 C)  Wt 135 lb 12.8 oz (61.598 kg)  General:  alert and no distress   Breasts:  inspection negative, no nipple discharge or bleeding, no masses or nodularity palpable  Lungs: clear to auscultation bilaterally  Heart:  regular rate and rhythm, S1, S2 normal, no murmur, click, rub or gallop  Abdomen: normal findings: soft, non-tender   Vulva:  normal  Vagina: normal vagina, no discharge, exudate, lesion, or erythema  Cervix:  no cervical motion tenderness  Corpus: normal size, contour, position, consistency, mobility, non-tender  Adnexa:  no mass, fullness, tenderness  Rectal Exam: Not performed.          Assessment:     Normal postpartum exam. Pap smear not done at today's visit.   Dysmenorrhea Contraception Constipation  Plan:    1. Contraception: Depo-Provera injections 2. Depo  Rx 3. Follow up in: 3 weeks or as needed.   Healthy lifestyle practices reviewed

## 2014-12-23 ENCOUNTER — Telehealth: Payer: Self-pay | Admitting: *Deleted

## 2014-12-23 NOTE — Telephone Encounter (Signed)
Patient is requesting a prescription for her headaches. Patient is also requesting to be changed from Vicoprofen to Oxycodone. Patient states she is breast feeding and the Vicoprofen is upsetting the baby's stomach. Patient was referred to the neurologist in December 2015 and has not been yet.

## 2014-12-24 ENCOUNTER — Other Ambulatory Visit: Payer: Self-pay | Admitting: Obstetrics

## 2014-12-24 DIAGNOSIS — G44019 Episodic cluster headache, not intractable: Secondary | ICD-10-CM

## 2014-12-24 MED ORDER — BUTALBITAL-APAP-CAFFEINE 50-325-40 MG PO TABS: 2.0000 | ORAL_TABLET | Freq: Four times a day (QID) | ORAL | Status: DC | PRN

## 2014-12-24 NOTE — Telephone Encounter (Signed)
Fioricet Rx.  Needs to make appt with Neurologist.

## 2014-12-27 NOTE — Telephone Encounter (Signed)
Patient contacted the office and left phone number [phone]. Attempted to contact the patient and recording stated not accepting calls at this time. Unable to leave a message.

## 2014-12-30 NOTE — Telephone Encounter (Signed)
Patient advised of Dr. Verdell CarmineHarper's recommendations. Patient will come by to pick up prescription.

## 2015-03-10 ENCOUNTER — Ambulatory Visit: Payer: Medicaid Other | Admitting: Obstetrics

## 2015-04-29 ENCOUNTER — Ambulatory Visit (INDEPENDENT_AMBULATORY_CARE_PROVIDER_SITE_OTHER): Payer: Medicaid Other | Admitting: Obstetrics

## 2015-04-29 ENCOUNTER — Encounter: Payer: Self-pay | Admitting: Obstetrics

## 2015-04-29 VITALS — BP 141/95 | HR 110 | Temp 99.6°F | Ht 63.0 in | Wt 138.0 lb

## 2015-04-29 DIAGNOSIS — Z01419 Encounter for gynecological examination (general) (routine) without abnormal findings: Secondary | ICD-10-CM

## 2015-04-29 DIAGNOSIS — G44221 Chronic tension-type headache, intractable: Secondary | ICD-10-CM

## 2015-04-29 DIAGNOSIS — Z0000 Encounter for general adult medical examination without abnormal findings: Secondary | ICD-10-CM | POA: Diagnosis not present

## 2015-04-29 DIAGNOSIS — M25559 Pain in unspecified hip: Secondary | ICD-10-CM

## 2015-04-29 LAB — CBC
HCT: 39.5 % (ref 36.0–46.0)
Hemoglobin: 13.6 g/dL (ref 12.0–15.0)
MCH: 36.3 pg — ABNORMAL HIGH (ref 26.0–34.0)
MCHC: 34.4 g/dL (ref 30.0–36.0)
MCV: 105.3 fL — ABNORMAL HIGH (ref 78.0–100.0)
MPV: 9 fL (ref 8.6–12.4)
PLATELETS: 560 10*3/uL — AB (ref 150–400)
RBC: 3.75 MIL/uL — ABNORMAL LOW (ref 3.87–5.11)
RDW: 14.4 % (ref 11.5–15.5)
WBC: 11.6 10*3/uL — AB (ref 4.0–10.5)

## 2015-04-29 LAB — COMPREHENSIVE METABOLIC PANEL
ALBUMIN: 4.6 g/dL (ref 3.6–5.1)
ALT: 6 U/L (ref 6–29)
AST: 9 U/L — AB (ref 10–30)
Alkaline Phosphatase: 70 U/L (ref 33–115)
BILIRUBIN TOTAL: 0.3 mg/dL (ref 0.2–1.2)
BUN: 10 mg/dL (ref 7–25)
CO2: 15 mmol/L — AB (ref 20–31)
Calcium: 8.9 mg/dL (ref 8.6–10.2)
Chloride: 111 mmol/L — ABNORMAL HIGH (ref 98–110)
Creat: 0.72 mg/dL (ref 0.50–1.10)
Glucose, Bld: 129 mg/dL — ABNORMAL HIGH (ref 65–99)
Potassium: 4.2 mmol/L (ref 3.5–5.3)
Sodium: 136 mmol/L (ref 135–146)
TOTAL PROTEIN: 7.2 g/dL (ref 6.1–8.1)

## 2015-04-29 LAB — TSH: TSH: 0.809 u[IU]/mL (ref 0.350–4.500)

## 2015-04-29 NOTE — Progress Notes (Signed)
Subjective:        Carmen Noble is a 29 y.o. female here for a routine exam.  Current complaints: none.    Personal health questionnaire:  Is patient Ashkenazi Jewish, have a family history of breast and/or ovarian cancer: no Is there a family history of uterine cancer diagnosed at age < 41, gastrointestinal cancer, urinary tract cancer, family member who is a Personnel officer syndrome-associated carrier: no Is the patient overweight and hypertensive, family history of diabetes, personal history of gestational diabetes, preeclampsia or PCOS: no Is patient over 80, have PCOS,  family history of premature CHD under age 30, diabetes, smoke, have hypertension or peripheral artery disease:  no At any time, has a partner hit, kicked or otherwise hurt or frightened you?: no Over the past 2 weeks, have you felt down, depressed or hopeless?: no Over the past 2 weeks, have you felt little interest or pleasure in doing things?:no   Gynecologic History No LMP recorded. Contraception: condom Last Pap: 2015. Results were: normal Last mammogram: n/a. Results were: n/a  Obstetric History OB History  Gravida Para Term Preterm AB SAB TAB Ectopic Multiple Living  4 3 3  1  1   3     # Outcome Date GA Lbr Len/2nd Weight Sex Delivery Anes PTL Lv  4 Term 10/22/14 [redacted]w[redacted]d  5 lb 15 oz (2.693 kg) F Vag-Spont None N Y  3 Term 09/23/08    F Vag-Spont  N Y  2 Term 09/14/04    Kateri Plummer Y  1 TAB 1998              Past Medical History  Diagnosis Date  . Hypertension   . Migraine   . Depression     on zoloft    Past Surgical History  Procedure Laterality Date  . Hernia repair    . Appendectomy    . Ankle surgery    . Mouth surgery    . Hernia repair  2010  . Appendectomy  2008     Current outpatient prescriptions:  .  acetaminophen (TYLENOL) 325 MG tablet, Take 650 mg by mouth every 6 (six) hours as needed for headache., Disp: , Rfl:  .  albuterol (PROVENTIL HFA;VENTOLIN HFA) 108 (90 BASE)  MCG/ACT inhaler, Inhale 2 puffs into the lungs every 6 (six) hours as needed for wheezing or shortness of breath., Disp: 1 Inhaler, Rfl: 1 .  medroxyPROGESTERone (DEPO-PROVERA) 150 MG/ML injection, Inject 1 mL (150 mg total) into the muscle every 3 (three) months. (Patient not taking: Reported on 04/29/2015), Disp: 1 mL, Rfl: 3 Allergies  Allergen Reactions  . Bee Venom Anaphylaxis  . Penicillins Anaphylaxis    Social History  Substance Use Topics  . Smoking status: Current Every Day Smoker -- 0.50 packs/day for 15 years    Types: Cigarettes  . Smokeless tobacco: Never Used     Comment: thinking about it  . Alcohol Use: No    Family History  Problem Relation Age of Onset  . Diabetes Father       Review of Systems  Constitutional: negative for fatigue and weight loss Respiratory: negative for cough and wheezing Cardiovascular: negative for chest pain, fatigue and palpitations Gastrointestinal: negative for abdominal pain and change in bowel habits Musculoskeletal:negative for myalgias Neurological: negative for gait problems and tremors Behavioral/Psych: negative for abusive relationship, depression Endocrine: negative for temperature intolerance   Genitourinary:negative for abnormal menstrual periods, genital lesions, hot flashes, sexual problems and vaginal discharge  Integument/breast: negative for breast lump, breast tenderness, nipple discharge and skin lesion(s)    Objective:       BP 141/95 mmHg  Pulse 110  Temp(Src) 99.6 F (37.6 C)  Ht  (1.6 m)  Wt 138 lb (62.596 kg)  BMI 24.45 kg/m2  Breastfeeding? No General:   alert  Skin:   no rash or abnormalities  Lungs:   clear to auscultation bilaterally  Heart:   regular rate and rhythm, S1, S2 normal, no murmur, click, rub or gallop  Breasts:   normal without suspicious masses, skin or nipple changes or axillary nodes  Abdomen:  normal findings: no organomegaly, soft, non-tender and no hernia  Pelvis:  External  genitalia: normal general appearance Urinary system: urethral meatus normal and bladder without fullness, nontender Vaginal: normal without tenderness, induration or masses Cervix: normal appearance Adnexa: normal bimanual exam Uterus: anteverted and non-tender, normal size   Lab Review Urine pregnancy test Labs reviewed yes Radiologic studies reviewed no    Assessment:    Healthy female exam.    Plan:    Education reviewed: depression evaluation, low fat, low cholesterol diet, safe sex/STD prevention, self breast exams, smoking cessation and weight bearing exercise. Contraception: condoms. Follow up in: 1 year.   No orders of the defined types were placed in this encounter.   Orders Placed This Encounter  Procedures  . SureSwab, Vaginosis/Vaginitis Plus  . Comprehensive metabolic panel  . TSH  . CBC  . Ambulatory referral to Neurology    Referral Priority:  Routine    Referral Type:  Consultation    Referral Reason:  Specialty Services Required    Requested Specialty:  Neurology    Number of Visits Requested:  1  . AMB referral to orthopedics    Referral Priority:  Routine    Referral Type:  Consultation    Number of Visits Requested:  1

## 2015-04-30 ENCOUNTER — Telehealth: Payer: Self-pay

## 2015-04-30 ENCOUNTER — Encounter: Payer: Self-pay | Admitting: Obstetrics

## 2015-04-30 NOTE — Telephone Encounter (Signed)
PATIENT ABLE TO GET APPTS AT [facility] AND [facility] AT [place], [facility] CALLED HER TO MAKE APPT

## 2015-05-01 LAB — PAP, TP IMAGING W/ HPV RNA, RFLX HPV TYPE 16,18/45: HPV mRNA, High Risk: DETECTED — AB

## 2015-05-02 LAB — SURESWAB, VAGINOSIS/VAGINITIS PLUS
Atopobium vaginae: NOT DETECTED Log (cells/mL)
BV CATEGORY: UNDETERMINED — AB
C. GLABRATA, DNA: NOT DETECTED
C. TROPICALIS, DNA: NOT DETECTED
C. albicans, DNA: NOT DETECTED
C. parapsilosis, DNA: NOT DETECTED
C. trachomatis RNA, TMA: NOT DETECTED
GARDNERELLA VAGINALIS: 6.9 Log (cells/mL)
LACTOBACILLUS SPECIES: 6.5 Log (cells/mL)
MEGASPHAERA SPECIES: NOT DETECTED Log (cells/mL)
N. gonorrhoeae RNA, TMA: NOT DETECTED
T. VAGINALIS RNA, QL TMA: NOT DETECTED

## 2015-05-02 LAB — HPV TYPE 16 AND 18/45 RNA
HPV TYPE 16 RNA: NOT DETECTED
HPV Type 18/45 RNA: NOT DETECTED

## 2015-05-30 ENCOUNTER — Ambulatory Visit (INDEPENDENT_AMBULATORY_CARE_PROVIDER_SITE_OTHER): Payer: Medicaid Other | Admitting: Neurology

## 2015-05-30 ENCOUNTER — Telehealth: Payer: Self-pay | Admitting: *Deleted

## 2015-05-30 ENCOUNTER — Encounter: Payer: Self-pay | Admitting: Neurology

## 2015-05-30 VITALS — BP 110/76 | HR 68 | Temp 98.1°F | Ht 62.0 in | Wt 144.0 lb

## 2015-05-30 DIAGNOSIS — G44221 Chronic tension-type headache, intractable: Secondary | ICD-10-CM | POA: Diagnosis not present

## 2015-05-30 DIAGNOSIS — G4440 Drug-induced headache, not elsewhere classified, not intractable: Secondary | ICD-10-CM

## 2015-05-30 DIAGNOSIS — F17200 Nicotine dependence, unspecified, uncomplicated: Secondary | ICD-10-CM | POA: Insufficient documentation

## 2015-05-30 DIAGNOSIS — Z720 Tobacco use: Secondary | ICD-10-CM

## 2015-05-30 DIAGNOSIS — M542 Cervicalgia: Secondary | ICD-10-CM | POA: Diagnosis not present

## 2015-05-30 DIAGNOSIS — G4441 Drug-induced headache, not elsewhere classified, intractable: Secondary | ICD-10-CM | POA: Diagnosis not present

## 2015-05-30 MED ORDER — NORTRIPTYLINE HCL 25 MG PO CAPS
25.0000 mg | ORAL_CAPSULE | Freq: Every day | ORAL | Status: DC
Start: 2015-05-30 — End: 2020-06-30

## 2015-05-30 NOTE — Progress Notes (Signed)
NEUROLOGY CONSULTATION NOTE  Carmen Noble MRN: [id number] DOB: 08-31-1985  Referring provider: Dr. Clearance Coots  Primary care provider: Dr. Concepcion Elk  Reason for consult:  Headache  HISTORY OF PRESENT ILLNESS: Carmen Noble is a 29 year old right-handed woman who is current smoker who presents for headache.  History obtained by patient and OBGYN note.  Prior head CT and recent labs reviewed.  She gave birth to her last child in February.  She is no longer breastfeeding.  Onset:  6 years ago, after birth of her middle child. Location:  Bi-frontal and back of head.  Also with neck pain. Quality:  Varies; pressure, pounding, throbbing Intensity:  7-8/10 Aura:  no Prodrome:  no Associated symptoms:  Photophobia.  No nausea. Duration:  10 hours.  Always wakes up with it. Frequency:  daily Triggers/exacerbating factors:  nothing Relieving factors:  Cracking her neck Activity:  Able to function  Past abortive therapy:  Fioricet (effective) Past preventative therapy:  none  Current abortive therapy:  Tylenol and Advil (daily) Current preventative therapy:  None  CT of head from 2013 was unremarkable. Recent TSH from August was normal.  Caffeine:  Mt. Dew daily Alcohol:  no Smoker:  yes Diet:  Does not drink a lot of water Exercise:  Not routine Depression/stress:  okay Sleep hygiene:  poor Family history of headache:  Sister, father  PAST MEDICAL HISTORY: Past Medical History  Diagnosis Date  . Hypertension   . Migraine   . Depression     on zoloft    PAST SURGICAL HISTORY: Past Surgical History  Procedure Laterality Date  . Hernia repair    . Appendectomy    . Ankle surgery    . Mouth surgery    . Hernia repair  2010  . Appendectomy  2008    MEDICATIONS: Current Outpatient Prescriptions on File Prior to Visit  Medication Sig Dispense Refill  . acetaminophen (TYLENOL) 325 MG tablet Take 650 mg by mouth every 6 (six) hours as needed for headache.    .  albuterol (PROVENTIL HFA;VENTOLIN HFA) 108 (90 BASE) MCG/ACT inhaler Inhale 2 puffs into the lungs every 6 (six) hours as needed for wheezing or shortness of breath. 1 Inhaler 1  . medroxyPROGESTERone (DEPO-PROVERA) 150 MG/ML injection Inject 1 mL (150 mg total) into the muscle every 3 (three) months. 1 mL 3   No current facility-administered medications on file prior to visit.    ALLERGIES: Allergies  Allergen Reactions  . Bee Venom Anaphylaxis  . Penicillins Anaphylaxis    FAMILY HISTORY: Family History  Problem Relation Age of Onset  . Diabetes Father     SOCIAL HISTORY: Social History   Social History  . Marital Status: Single    Spouse Name: N/A  . Number of Children: N/A  . Years of Education: N/A   Occupational History  . Not on file.   Social History Main Topics  . Smoking status: Current Every Day Smoker -- 0.50 packs/day for 15 years    Types: Cigarettes  . Smokeless tobacco: Never Used     Comment: thinking about it  . Alcohol Use: No  . Drug Use: No     Comment: hx of drug abuse as teenager. last use 5-6 year ago  . Sexual Activity:    Partners: Male    Birth Control/ Protection: Condom   Other Topics Concern  . Not on file   Social History Narrative    REVIEW OF SYSTEMS: Constitutional: No fevers, chills,  or sweats, no generalized fatigue, change in appetite Eyes: No visual changes, double vision, eye pain Ear, nose and throat: No hearing loss, ear pain, nasal congestion, sore throat Cardiovascular: No chest pain, palpitations Respiratory:  No shortness of breath at rest or with exertion, wheezes GastrointestinaI: No nausea, vomiting, diarrhea, abdominal pain, fecal incontinence Genitourinary:  No dysuria, urinary retention or frequency Musculoskeletal:  Neck pain Integumentary: No rash, pruritus, skin lesions Neurological: as above Psychiatric: No depression, insomnia, anxiety Endocrine: No palpitations, fatigue, diaphoresis, mood swings,  change in appetite, change in weight, increased thirst Hematologic/Lymphatic:  No anemia, purpura, petechiae. Allergic/Immunologic: no itchy/runny eyes, nasal congestion, recent allergic reactions, rashes  PHYSICAL EXAM: Filed Vitals:   05/30/15 1244  BP: 110/76  Pulse: 68  Temp: 98.1 F (36.7 C)   General: No acute distress.   Head:  Normocephalic/atraumatic Eyes:  fundi unremarkable, without vessel changes, exudates, hemorrhages or papilledema. Neck: supple, no paraspinal tenderness, full range of motion Back: No paraspinal tenderness Heart: regular rate and rhythm Lungs: Clear to auscultation bilaterally. Vascular: No carotid bruits. Neurological Exam: Mental status: alert and oriented to person, place, and time, recent and remote memory intact, fund of knowledge intact, attention and concentration intact, speech fluent and not dysarthric, language intact. Cranial nerves: CN I: not tested CN II: pupils equal, round and reactive to light, visual fields intact, fundi unremarkable, without vessel changes, exudates, hemorrhages or papilledema. CN III, IV, VI:  full range of motion, no nystagmus, no ptosis CN V: facial sensation intact CN VII: upper and lower face symmetric CN VIII: hearing intact CN IX, X: gag intact, uvula midline CN XI: sternocleidomastoid and trapezius muscles intact CN XII: tongue midline Bulk & Tone: normal, no fasciculations. Motor:  5/5 throughout  Sensation:  Pinprick and vibration sensation intact. Deep Tendon Reflexes:  2+ throughout, toes downgoing.  Finger to nose testing:  Without dysmetria.  Heel to shin:  Without dysmetria.  Gait:  Normal station and stride.  Able to turn and tandem walk. Romberg negative.  IMPRESSION: Chronic tension type headache complicated by medication-overuse Neck pain  PLAN: 1.  Start nortriptyline  at bedtime 2.  Limit use of Advil or Tylenol to no more than 2 days out of the week 3.  Do not use Fioricet 4.   Increase water intake, stop soda, smoking cessation discussed 5.  PT for neck 6.  Consider magnesium, riboflavin or coenzyme Q-10. 7.  Follow up in 3 months but call in 4 weeks with update.  Thank you for allowing me to take part in the care of this patient.  Shon Millet, DO  CC:  Fleet Contras, MD  Coral Ceo, MD

## 2015-05-30 NOTE — Telephone Encounter (Signed)
Faxed over referral form to [facility] with todays office note and demographics and insurance card

## 2015-05-30 NOTE — Patient Instructions (Addendum)
1.  Start nortriptyline  at bedtime.  CALL IN 4 WEEKS WITH UPDATE 2.  Limit use of Tylenol and Advil to no more than 2 days out of the week (so 5 days out of the week, do not take anything).  Try to take less if possible.  Do not take Fioricet at all. 3.  Smoking cessation  4.  Sleep hygiene 5.  Routine exercise 6.  Stop Mt. Dew.  Drink increased water 7.  Refer to physical therapy for neck pain. 8.  Follow up in 3 months. 9.  Consider supplements:  Magnesium oxide  to  daily, riboflavin , Coenzyme Q 10  three times daily

## 2015-06-05 NOTE — Telephone Encounter (Signed)
We have  received letter from  Physical therapy that patient has medicaid, which does not pay for her physical therapy  and she declines to pay for visits

## 2015-06-11 ENCOUNTER — Other Ambulatory Visit: Payer: Self-pay | Admitting: *Deleted

## 2015-06-11 DIAGNOSIS — M542 Cervicalgia: Secondary | ICD-10-CM

## 2015-06-11 DIAGNOSIS — G44221 Chronic tension-type headache, intractable: Secondary | ICD-10-CM

## 2015-09-12 ENCOUNTER — Other Ambulatory Visit: Payer: Self-pay | Admitting: Obstetrics

## 2015-09-12 ENCOUNTER — Ambulatory Visit (INDEPENDENT_AMBULATORY_CARE_PROVIDER_SITE_OTHER): Payer: Medicaid Other | Admitting: Obstetrics

## 2015-09-12 ENCOUNTER — Encounter: Payer: Self-pay | Admitting: Obstetrics

## 2015-09-12 VITALS — BP 136/69 | HR 105 | Wt 149.0 lb

## 2015-09-12 DIAGNOSIS — N946 Dysmenorrhea, unspecified: Secondary | ICD-10-CM

## 2015-09-12 DIAGNOSIS — D509 Iron deficiency anemia, unspecified: Secondary | ICD-10-CM | POA: Diagnosis not present

## 2015-09-12 DIAGNOSIS — Z30013 Encounter for initial prescription of injectable contraceptive: Secondary | ICD-10-CM | POA: Diagnosis not present

## 2015-09-12 DIAGNOSIS — F17200 Nicotine dependence, unspecified, uncomplicated: Secondary | ICD-10-CM

## 2015-09-12 MED ORDER — VARENICLINE TARTRATE 0.5 MG X 11 & 1 MG X 42 PO MISC: ORAL | Status: DC

## 2015-09-12 MED ORDER — VARENICLINE TARTRATE 1 MG PO TABS: 1.0000 mg | ORAL_TABLET | Freq: Two times a day (BID) | ORAL | Status: DC

## 2015-09-12 MED ORDER — IBUPROFEN 800 MG PO TABS: 800.0000 mg | ORAL_TABLET | Freq: Three times a day (TID) | ORAL | Status: AC | PRN

## 2015-09-12 MED ORDER — MEDROXYPROGESTERONE ACETATE 150 MG/ML IM SUSP: 150.0000 mg | INTRAMUSCULAR | Status: DC

## 2015-09-12 MED ORDER — VITAFOL FE+ 90-1-200 & 50 MG PO CPPK: 1.0000 | ORAL_CAPSULE | Freq: Every day | ORAL | Status: DC

## 2015-09-12 MED ORDER — OXYCODONE HCL 10 MG PO TABS: 10.0000 mg | ORAL_TABLET | Freq: Four times a day (QID) | ORAL | Status: DC | PRN

## 2015-09-12 NOTE — Progress Notes (Signed)
Subjective:    Carmen Noble is a 30 y.o. female who presents for contraception counseling. The patient has no complaints today. The patient is sexually active. Pertinent past medical history: current smoker.  The information documented in the HPI was reviewed and verified.  Menstrual History: OB History    Gravida Para Term Preterm AB TAB SAB Ectopic Multiple Living   4 3 3  1 1    3       Menarche age: 15  Patient's last menstrual period was 08/17/2015 (approximate).   Patient Active Problem List   Diagnosis Date Noted  . Chronic tension-type headache, intractable 05/30/2015  . Tobacco use disorder 05/30/2015  . Pregnancy with abdominal pain of left upper quadrant, antepartum   . [redacted] weeks gestation of pregnancy   . Chlamydial cervicitis 04/04/2014  . Dysmenorrhea 06/27/2013  . Other and unspecified ovarian cyst 06/27/2013   Past Medical History  Diagnosis Date  . Hypertension   . Migraine   . Depression     on zoloft    Past Surgical History  Procedure Laterality Date  . Hernia repair    . Appendectomy    . Ankle surgery    . Mouth surgery    . Hernia repair  2010  . Appendectomy  2008     Current outpatient prescriptions:  .  acetaminophen (TYLENOL) 325 MG tablet, Take 650 mg by mouth every 6 (six) hours as needed for headache. Reported on 09/12/2015, Disp: , Rfl:  .  albuterol (PROVENTIL HFA;VENTOLIN HFA) 108 (90 BASE) MCG/ACT inhaler, Inhale 2 puffs into the lungs every 6 (six) hours as needed for wheezing or shortness of breath. (Patient not taking: Reported on 09/12/2015), Disp: 1 Inhaler, Rfl: 1 .  ibuprofen (ADVIL,MOTRIN) 800 MG tablet, Take 1 tablet (800 mg total) by mouth every 8 (eight) hours as needed., Disp: 30 tablet, Rfl: 5 .  medroxyPROGESTERone (DEPO-PROVERA) 150 MG/ML injection, Inject 1 mL (150 mg total) into the muscle every 3 (three) months., Disp: 1 mL, Rfl: 3 .  nortriptyline (PAMELOR) 25 MG capsule, Take 1 capsule (25 mg total) by mouth at  bedtime. (Patient not taking: Reported on 09/12/2015), Disp: 30 capsule, Rfl: 2 .  Oxycodone HCl 10 MG TABS, Take 1 tablet (10 mg total) by mouth every 6 (six) hours as needed., Disp: 40 tablet, Rfl: 0 .  Prenat-FePoly-Metf-FA-DHA-DSS (VITAFOL FE+) 90-1-200 & 50 MG CPPK, Take 1 capsule by mouth daily before breakfast., Disp: 60 each, Rfl: 11 Allergies  Allergen Reactions  . Bee Venom Anaphylaxis  . Penicillins Anaphylaxis    Social History  Substance Use Topics  . Smoking status: Current Every Day Smoker -- 0.50 packs/day for 15 years    Types: Cigarettes  . Smokeless tobacco: Never Used     Comment: thinking about it  . Alcohol Use: No    Family History  Problem Relation Age of Onset  . Diabetes Father        Review of Systems Constitutional: negative for weight loss Genitourinary:negative for abnormal menstrual periods and vaginal discharge  Objective:   BP 136/69 mmHg  Pulse 105  Wt 149 lb (67.586 kg)  LMP 08/17/2015 (Approximate)   Lab Review Urine pregnancy test Labs reviewed yes Radiologic studies reviewed yes  100% of 10 min visit spent on counseling and coordination of care.   Assessment:    30 y.o., starting Depo-Provera injections, no contraindications.    Dysmenorrhea  Plan:    All questions answered.    Depo Provera  Rx  Oxycodone Rx  F/U prn    Meds ordered this encounter  Medications  . medroxyPROGESTERone (DEPO-PROVERA) 150 MG/ML injection    Sig: Inject 1 mL (150 mg total) into the muscle every 3 (three) months.    Dispense:  1 mL    Refill:  3  . Oxycodone HCl 10 MG TABS    Sig: Take 1 tablet (10 mg total) by mouth every 6 (six) hours as needed.    Dispense:  40 tablet    Refill:  0  . Prenat-FePoly-Metf-FA-DHA-DSS (VITAFOL FE+) 90-1-200 & 50 MG CPPK    Sig: Take 1 capsule by mouth daily before breakfast.    Dispense:  60 each    Refill:  11  . ibuprofen (ADVIL,MOTRIN) 800 MG tablet    Sig: Take 1 tablet (800 mg total) by mouth every  8 (eight) hours as needed.    Dispense:  30 tablet    Refill:  5   No orders of the defined types were placed in this encounter.

## 2015-09-16 ENCOUNTER — Ambulatory Visit: Payer: Medicaid Other | Admitting: Neurology

## 2015-09-23 ENCOUNTER — Telehealth: Payer: Self-pay | Admitting: *Deleted

## 2015-09-23 NOTE — Telephone Encounter (Signed)
Patient is calling for a refill of her pain medication. 11:29 Call to patient-  Patient can't pick up her Depo for another month- and she is requesting a refill of the pain medication. Patient is bleeding with cramping. Called the pharmacy and she did pick up a Depo in [facility] did not get it here) so that is why she can't fill it.

## 2015-09-24 NOTE — Telephone Encounter (Signed)
She has refills for Ibuprofen.  Can't continue Oxycodone.

## 2015-09-30 NOTE — Telephone Encounter (Signed)
Spoke to patient- she would like to do OCP until she can restart her Depo because she is still bleeding now. Told patient I would talk to Dr Clearance Coots and we would get something sent in for her.

## 2015-10-02 NOTE — Telephone Encounter (Signed)
OK to call in OCP's.

## 2015-10-07 NOTE — Telephone Encounter (Signed)
Patient notified we would send in OCP. Message forwarded to provider to Rx

## 2015-11-12 ENCOUNTER — Telehealth: Payer: Self-pay | Admitting: *Deleted

## 2015-11-12 NOTE — Telephone Encounter (Signed)
Patient was able to pick up her depo injection. She states she has not been sexually active at all and wants to get her shot. Explained that she needs to be on her cycle or follow the new start protocol. She has been having dark brownish discharge and cramping. She has 2 cycles in February.at the beginning and end. Told her we would talk to Dr Clearance Cootsharper about her start.

## 2015-11-13 ENCOUNTER — Other Ambulatory Visit (INDEPENDENT_AMBULATORY_CARE_PROVIDER_SITE_OTHER): Payer: Medicaid Other

## 2015-11-13 VITALS — BP 119/78 | HR 92 | Temp 98.4°F | Wt 151.0 lb

## 2015-11-13 DIAGNOSIS — Z3202 Encounter for pregnancy test, result negative: Secondary | ICD-10-CM | POA: Diagnosis not present

## 2015-11-13 DIAGNOSIS — Z3042 Encounter for surveillance of injectable contraceptive: Secondary | ICD-10-CM

## 2015-11-13 LAB — POCT URINE PREGNANCY: PREG TEST UR: NEGATIVE

## 2015-11-13 MED ORDER — MEDROXYPROGESTERONE ACETATE 150 MG/ML IM SUSP
150.0000 mg | INTRAMUSCULAR | Status: AC
  Administered 2015-11-13: 150 mg via INTRAMUSCULAR

## 2015-11-13 NOTE — Telephone Encounter (Signed)
Yes.  She can start Depo.

## 2015-11-13 NOTE — Telephone Encounter (Signed)
Call to patient - told her she could come back if she wanted and get her shot per Dr Clearance CootsHarper.

## 2015-11-13 NOTE — Progress Notes (Signed)
Patient in office today for a pregnancy test for depo restart. Patient states she was last sexually active about one week ago. Pregnancy test in office is negative. Patient advised to abstain from intercourse and return to the office in two weeks for second upt and upon negative results we will administer Depo injection. Patient advised if she starts a period before her next appointment she may call for an appointment to get her depo. Patient verbalized understanding.   BP 119/78 mmHg  Pulse 92  Temp(Src) 98.4 F (36.9 C)  Wt 151 lb (68.493 kg)

## 2015-11-13 NOTE — Addendum Note (Signed)
Addended by: Henriette CombsHATTON, Lynton Crescenzo L on: 11/13/2015 01:47 PM   Modules accepted: Orders

## 2015-11-13 NOTE — Progress Notes (Signed)
After review of phone call patient was notified per Dr. Clearance CootsHarper she may come in to office and get her injection today. Patient chooses to come in today to get her injection. Patient to return to office February 04, 2016 for next injection.   Administrations This Visit    medroxyPROGESTERone (DEPO-PROVERA) injection 150 mg    Admin Date Action Dose Route Administered By         11/13/2015 Given 150 mg Intramuscular Henriette CombsAndrea L Khambrel Amsden, LPN

## 2015-11-17 ENCOUNTER — Other Ambulatory Visit: Payer: Self-pay | Admitting: Obstetrics

## 2015-11-17 ENCOUNTER — Telehealth: Payer: Self-pay | Admitting: *Deleted

## 2015-11-17 DIAGNOSIS — N946 Dysmenorrhea, unspecified: Secondary | ICD-10-CM

## 2015-11-17 MED ORDER — TRAMADOL HCL 50 MG PO TABS: 100.0000 mg | ORAL_TABLET | Freq: Four times a day (QID) | ORAL | Status: DC | PRN

## 2015-11-17 NOTE — Telephone Encounter (Signed)
Patient got her Depo last week and now she has stared her cycle. She is requesting tramadol for her cramping.

## 2015-11-27 ENCOUNTER — Other Ambulatory Visit: Payer: Medicaid Other

## 2015-12-02 ENCOUNTER — Encounter: Payer: Self-pay | Admitting: Obstetrics

## 2015-12-02 ENCOUNTER — Ambulatory Visit (INDEPENDENT_AMBULATORY_CARE_PROVIDER_SITE_OTHER): Payer: Medicaid Other | Admitting: Obstetrics

## 2015-12-02 VITALS — BP 122/87 | HR 110 | Temp 99.0°F | Wt 146.0 lb

## 2015-12-02 DIAGNOSIS — Z3042 Encounter for surveillance of injectable contraceptive: Secondary | ICD-10-CM | POA: Diagnosis not present

## 2015-12-02 DIAGNOSIS — N939 Abnormal uterine and vaginal bleeding, unspecified: Secondary | ICD-10-CM

## 2015-12-02 DIAGNOSIS — Z1389 Encounter for screening for other disorder: Secondary | ICD-10-CM

## 2015-12-02 LAB — POCT URINALYSIS DIPSTICK
BILIRUBIN UA: NEGATIVE
Blood, UA: NEGATIVE
GLUCOSE UA: NEGATIVE
Ketones, UA: NEGATIVE
LEUKOCYTES UA: NEGATIVE
NITRITE UA: NEGATIVE
PH UA: 6
Protein, UA: NEGATIVE
Spec Grav, UA: 1.01
Urobilinogen, UA: 0.2

## 2015-12-02 NOTE — Progress Notes (Signed)
Patient ID: Carmen Noble, female   DOB: 1986/05/18, 30 y.o.   MRN: [id number]  Chief Complaint  Patient presents with  . Vaginal Bleeding    since depo injection in March    HPI Carmen Noble is a 30 y.o. female.  Irregular vaginal bleeding with Depo Provera.  On 1st 3 months Depo. HPI  Past Medical History  Diagnosis Date  . Hypertension   . Migraine   . Depression     on zoloft    Past Surgical History  Procedure Laterality Date  . Hernia repair    . Appendectomy    . Ankle surgery    . Mouth surgery    . Hernia repair  2010  . Appendectomy  2008    Family History  Problem Relation Age of Onset  . Diabetes Father     Social History Social History  Substance Use Topics  . Smoking status: Current Every Day Smoker -- 0.50 packs/day for 15 years    Types: Cigarettes  . Smokeless tobacco: Never Used     Comment: thinking about it  . Alcohol Use: No    Allergies  Allergen Reactions  . Bee Venom Anaphylaxis  . Penicillins Anaphylaxis    Current Outpatient Prescriptions  Medication Sig Dispense Refill  . acetaminophen (TYLENOL) 325 MG tablet Take 650 mg by mouth every 6 (six) hours as needed for headache. Reported on 09/12/2015    . albuterol (PROVENTIL HFA;VENTOLIN HFA) 108 (90 BASE) MCG/ACT inhaler Inhale 2 puffs into the lungs every 6 (six) hours as needed for wheezing or shortness of breath. 1 Inhaler 1  . ibuprofen (ADVIL,MOTRIN) 800 MG tablet Take 1 tablet (800 mg total) by mouth every 8 (eight) hours as needed. 30 tablet 5  . Oxycodone HCl 10 MG TABS Take 1 tablet (10 mg total) by mouth every 6 (six) hours as needed. 40 tablet 0  . Prenat-FePoly-Metf-FA-DHA-DSS (VITAFOL FE+) 90-1-200 & 50 MG CPPK Take 1 capsule by mouth daily before breakfast. 60 each 11  . traMADol (ULTRAM) 50 MG tablet Take 2 tablets (100 mg total) by mouth every 6 (six) hours as needed for moderate pain or severe pain. 40 tablet 2  . nortriptyline (PAMELOR) 25 MG capsule Take 1  capsule (25 mg total) by mouth at bedtime. (Patient not taking: Reported on 09/12/2015) 30 capsule 2  . varenicline (CHANTIX CONTINUING MONTH PAK) 1 MG tablet Take 1 tablet (1 mg total) by mouth 2 (two) times daily. (Patient not taking: Reported on 12/02/2015) 60 tablet 1  . varenicline (CHANTIX STARTING MONTH PAK) 0.5 MG X 11 & 1 MG X 42 tablet Take one 0.5 mg tablet by mouth once daily for 3 days, then increase to one 0.5 mg tablet twice daily for 4 days, then increase to one 1 mg tablet twice daily. (Patient not taking: Reported on 12/02/2015) 53 tablet 0   Current Facility-Administered Medications  Medication Dose Route Frequency Provider Last Rate Last Dose  . medroxyPROGESTERone (DEPO-PROVERA) injection 150 mg  150 mg Intramuscular Q90 days Brock Bad, MD   150 mg at 11/13/15 1345    Review of Systems Review of Systems Constitutional: negative for fatigue and weight loss Respiratory: negative for cough and wheezing Cardiovascular: negative for chest pain, fatigue and palpitations Gastrointestinal: negative for abdominal pain and change in bowel habits Genitourinary: positive for AUB on Depo Provera, 1st quarter Integument/breast: negative for nipple discharge Musculoskeletal:negative for myalgias Neurological: negative for gait problems and tremors Behavioral/Psych: negative for  abusive relationship, depression Endocrine: negative for temperature intolerance     Blood pressure 122/87, pulse 110, temperature 99 F (37.2 C), weight 146 lb (66.225 kg), not currently breastfeeding.  Physical Exam Physical Exam: Deferred  100% of 15 min visit spent on counseling and coordination of care.   Data Reviewed Labs  Assessment     Contraceptive Surveillance of Depo Provera. AUB     Plan    Normal for 1st 3 months.  No intervention necessary.  Explained to patient's satisfaction. Continue Depo Provera F/U in 12 weeks    Orders Placed This Encounter  Procedures  . POCT  urinalysis dipstick   No orders of the defined types were placed in this encounter.

## 2015-12-03 ENCOUNTER — Ambulatory Visit: Payer: Medicaid Other | Admitting: Obstetrics

## 2015-12-12 ENCOUNTER — Other Ambulatory Visit: Payer: Self-pay | Admitting: Obstetrics

## 2015-12-12 ENCOUNTER — Telehealth: Payer: Self-pay | Admitting: *Deleted

## 2015-12-12 NOTE — Telephone Encounter (Signed)
Patient is requesting a refill of Tramadol. She states her bleeding is some better - but she is still cramping.

## 2015-12-12 NOTE — Telephone Encounter (Signed)
Rx denied.  She recieved Rx for #40 and 2 refills on 11-17-15.

## 2015-12-17 NOTE — Telephone Encounter (Signed)
Patient notified about her refills

## 2016-01-07 ENCOUNTER — Ambulatory Visit: Payer: Medicaid Other | Admitting: Neurology

## 2016-02-04 ENCOUNTER — Other Ambulatory Visit: Payer: Self-pay | Admitting: Obstetrics

## 2016-02-04 ENCOUNTER — Other Ambulatory Visit: Payer: Medicaid Other

## 2016-02-04 ENCOUNTER — Other Ambulatory Visit (INDEPENDENT_AMBULATORY_CARE_PROVIDER_SITE_OTHER): Payer: Medicaid Other | Admitting: *Deleted

## 2016-02-04 VITALS — BP 117/80 | HR 109 | Wt 146.0 lb

## 2016-02-04 DIAGNOSIS — Z3042 Encounter for surveillance of injectable contraceptive: Secondary | ICD-10-CM

## 2016-02-04 DIAGNOSIS — N946 Dysmenorrhea, unspecified: Secondary | ICD-10-CM

## 2016-02-04 MED ORDER — MEDROXYPROGESTERONE ACETATE 150 MG/ML IM SUSP
150.0000 mg | Freq: Once | INTRAMUSCULAR | Status: AC
  Administered 2016-02-04: 150 mg via INTRAMUSCULAR

## 2016-02-04 MED ORDER — TRAMADOL HCL 50 MG PO TABS: 100.0000 mg | ORAL_TABLET | Freq: Four times a day (QID) | ORAL | Status: DC | PRN

## 2016-02-04 NOTE — Progress Notes (Unsigned)
Patient tolerated Depo in [facility]. RTO 04-27-16

## 2016-02-11 ENCOUNTER — Other Ambulatory Visit: Payer: Self-pay | Admitting: Obstetrics

## 2016-02-12 NOTE — Telephone Encounter (Signed)
Review for refill. 

## 2016-02-18 ENCOUNTER — Encounter (HOSPITAL_COMMUNITY): Payer: Self-pay | Admitting: Emergency Medicine

## 2016-02-18 ENCOUNTER — Emergency Department (HOSPITAL_COMMUNITY)
Admission: EM | Admit: 2016-02-18 | Discharge: 2016-02-18 | Disposition: A | Discharge status: Home / Self Care | Payer: Medicaid Other | Attending: Dermatology | Admitting: Dermatology

## 2016-02-18 DIAGNOSIS — R1011 Right upper quadrant pain: Secondary | ICD-10-CM | POA: Diagnosis present

## 2016-02-18 DIAGNOSIS — Z5321 Procedure and treatment not carried out due to patient leaving prior to being seen by health care provider: Secondary | ICD-10-CM | POA: Diagnosis not present

## 2016-02-18 DIAGNOSIS — I10 Essential (primary) hypertension: Secondary | ICD-10-CM | POA: Diagnosis not present

## 2016-02-18 DIAGNOSIS — R197 Diarrhea, unspecified: Secondary | ICD-10-CM | POA: Insufficient documentation

## 2016-02-18 LAB — CBC
HEMATOCRIT: 40.4 % (ref 36.0–46.0)
Hemoglobin: 13.3 g/dL (ref 12.0–15.0)
MCH: 33.4 pg (ref 26.0–34.0)
MCHC: 32.9 g/dL (ref 30.0–36.0)
MCV: 101.5 fL — AB (ref 78.0–100.0)
PLATELETS: 436 10*3/uL — AB (ref 150–400)
RBC: 3.98 MIL/uL (ref 3.87–5.11)
RDW: 13.1 % (ref 11.5–15.5)
WBC: 15.4 10*3/uL — AB (ref 4.0–10.5)

## 2016-02-18 LAB — COMPREHENSIVE METABOLIC PANEL
ALT: 11 U/L — AB (ref 14–54)
AST: 13 U/L — ABNORMAL LOW (ref 15–41)
Albumin: 3.8 g/dL (ref 3.5–5.0)
Alkaline Phosphatase: 62 U/L (ref 38–126)
Anion gap: 7 (ref 5–15)
BILIRUBIN TOTAL: 0.5 mg/dL (ref 0.3–1.2)
BUN: 8 mg/dL (ref 6–20)
CHLORIDE: 114 mmol/L — AB (ref 101–111)
CO2: 18 mmol/L — ABNORMAL LOW (ref 22–32)
CREATININE: 0.73 mg/dL (ref 0.44–1.00)
Calcium: 9.1 mg/dL (ref 8.9–10.3)
GFR calc Af Amer: >60 mL/min (ref >60)
GLUCOSE: 116 mg/dL — AB (ref 65–99)
Potassium: 4 mmol/L (ref 3.5–5.1)
Sodium: 139 mmol/L (ref 135–145)
Total Protein: 6.8 g/dL (ref 6.5–8.1)

## 2016-02-18 LAB — URINALYSIS, ROUTINE W REFLEX MICROSCOPIC
BILIRUBIN URINE: NEGATIVE
GLUCOSE, UA: NEGATIVE mg/dL
KETONES UR: 15 mg/dL — AB
LEUKOCYTES UA: NEGATIVE
Nitrite: NEGATIVE
PH: 6 (ref 5.0–8.0)
PROTEIN: 30 mg/dL — AB
Specific Gravity, Urine: 1.022 (ref 1.005–1.030)

## 2016-02-18 LAB — URINE MICROSCOPIC-ADD ON
BACTERIA UA: NONE SEEN
RBC / HPF: NONE SEEN RBC/hpf (ref 0–5)

## 2016-02-18 LAB — I-STAT BETA HCG BLOOD, ED (MC, WL, AP ONLY): I-stat hCG, quantitative: <5 m[IU]/mL (ref <5)

## 2016-02-18 LAB — LIPASE, BLOOD: LIPASE: 19 U/L (ref 11–51)

## 2016-02-18 NOTE — ED Notes (Signed)
No answer when called to room

## 2016-02-18 NOTE — ED Notes (Signed)
Pt stated she started having pain in her upper right quadrant yesterday that has gotten worse over the night. Pt also reports vomiting x3 and episode of diarrhea last night.

## 2016-03-18 ENCOUNTER — Encounter: Payer: Self-pay | Admitting: Obstetrics

## 2016-03-18 ENCOUNTER — Ambulatory Visit (INDEPENDENT_AMBULATORY_CARE_PROVIDER_SITE_OTHER): Payer: Medicaid Other | Admitting: Obstetrics

## 2016-03-18 VITALS — BP 130/81 | HR 69 | Temp 99.0°F | Wt 143.0 lb

## 2016-03-18 DIAGNOSIS — N946 Dysmenorrhea, unspecified: Secondary | ICD-10-CM | POA: Diagnosis not present

## 2016-03-18 DIAGNOSIS — Z113 Encounter for screening for infections with a predominantly sexual mode of transmission: Secondary | ICD-10-CM | POA: Diagnosis not present

## 2016-03-18 DIAGNOSIS — B373 Candidiasis of vulva and vagina: Secondary | ICD-10-CM | POA: Diagnosis not present

## 2016-03-18 DIAGNOSIS — N898 Other specified noninflammatory disorders of vagina: Secondary | ICD-10-CM

## 2016-03-18 DIAGNOSIS — Z3042 Encounter for surveillance of injectable contraceptive: Secondary | ICD-10-CM | POA: Diagnosis not present

## 2016-03-18 DIAGNOSIS — B3731 Acute candidiasis of vulva and vagina: Secondary | ICD-10-CM

## 2016-03-18 MED ORDER — MEDROXYPROGESTERONE ACETATE 150 MG/ML IM SUSP: 150.0000 mg | INTRAMUSCULAR | Status: AC

## 2016-03-18 MED ORDER — FLUCONAZOLE 150 MG PO TABS: 150.0000 mg | ORAL_TABLET | Freq: Once | ORAL | Status: DC

## 2016-03-18 MED ORDER — TRAMADOL HCL 50 MG PO TABS: 100.0000 mg | ORAL_TABLET | Freq: Four times a day (QID) | ORAL | Status: DC | PRN

## 2016-03-18 NOTE — Progress Notes (Signed)
Patient ID: Carmen Noble, female   DOB: September 30, 1985, 30 y.o.   MRN: [id number]  Chief Complaint  Patient presents with  . Gynecologic Exam    std screening     HPI Carmen Noble is a 30 y.o. female.  Has cheesy white vaginal discharge and irritation.  Also has painful periods.  HPI  Past Medical History  Diagnosis Date  . Hypertension   . Migraine   . Depression     on zoloft    Past Surgical History  Procedure Laterality Date  . Hernia repair    . Appendectomy    . Ankle surgery    . Mouth surgery    . Hernia repair  2010  . Appendectomy  2008    Family History  Problem Relation Age of Onset  . Diabetes Father     Social History Social History  Substance Use Topics  . Smoking status: Current Every Day Smoker -- 0.50 packs/day for 15 years    Types: Cigarettes  . Smokeless tobacco: Never Used     Comment: thinking about it  . Alcohol Use: No    Allergies  Allergen Reactions  . Bee Venom Anaphylaxis  . Penicillins Anaphylaxis    Current Outpatient Prescriptions  Medication Sig Dispense Refill  . acetaminophen (TYLENOL) 325 MG tablet Take 650 mg by mouth every 6 (six) hours as needed for headache. Reported on 09/12/2015    . albuterol (PROVENTIL HFA;VENTOLIN HFA) 108 (90 BASE) MCG/ACT inhaler Inhale 2 puffs into the lungs every 6 (six) hours as needed for wheezing or shortness of breath. 1 Inhaler 1  . fluconazole (DIFLUCAN) 150 MG tablet Take 1 tablet (150 mg total) by mouth once. 1 tablet 2  . ibuprofen (ADVIL,MOTRIN) 800 MG tablet Take 1 tablet (800 mg total) by mouth every 8 (eight) hours as needed. 30 tablet 5  . medroxyPROGESTERone (DEPO-PROVERA) 150 MG/ML injection Inject 1 mL (150 mg total) into the muscle every 3 (three) months. 1 mL 3  . nortriptyline (PAMELOR) 25 MG capsule Take 1 capsule (25 mg total) by mouth at bedtime. (Patient not taking: Reported on 09/12/2015) 30 capsule 2  . Oxycodone HCl 10 MG TABS Take 1 tablet (10 mg total) by mouth  every 6 (six) hours as needed. 40 tablet 0  . Prenat-FePoly-Metf-FA-DHA-DSS (VITAFOL FE+) 90-1-200 & 50 MG CPPK Take 1 capsule by mouth daily before breakfast. 60 each 11  . traMADol (ULTRAM) 50 MG tablet Take 2 tablets (100 mg total) by mouth every 6 (six) hours as needed for moderate pain or severe pain. 40 tablet 2  . varenicline (CHANTIX CONTINUING MONTH PAK) 1 MG tablet Take 1 tablet (1 mg total) by mouth 2 (two) times daily. (Patient not taking: Reported on 12/02/2015) 60 tablet 1  . varenicline (CHANTIX STARTING MONTH PAK) 0.5 MG X 11 & 1 MG X 42 tablet Take one 0.5 mg tablet by mouth once daily for 3 days, then increase to one 0.5 mg tablet twice daily for 4 days, then increase to one 1 mg tablet twice daily. (Patient not taking: Reported on 12/02/2015) 53 tablet 0   Current Facility-Administered Medications  Medication Dose Route Frequency Provider Last Rate Last Dose  . medroxyPROGESTERone (DEPO-PROVERA) injection 150 mg  150 mg Intramuscular Q90 days Brock Badharles A Harper, MD   150 mg at 11/13/15 1345    Review of Systems Review of Systems Constitutional: negative for fatigue and weight loss Respiratory: negative for cough and wheezing Cardiovascular: negative for chest  pain, fatigue and palpitations Gastrointestinal: negative for abdominal pain and change in bowel habits Genitourinary:positive for white, cheesy vaginal discharge and irritation.  Painful periods. Integument/breast: negative for nipple discharge Musculoskeletal:negative for myalgias Neurological: positive for occasional migraine HA's Behavioral/Psych: negative for abusive relationship, depression Endocrine: negative for temperature intolerance     Blood pressure 130/81, pulse 69, temperature 99 F (37.2 C), weight 143 lb (64.864 kg), not currently breastfeeding.  Physical Exam Physical Exam General:   alert  Skin:   no rash or abnormalities  Lungs:   clear to auscultation bilaterally  Heart:   regular rate and  rhythm, S1, S2 normal, no murmur, click, rub or gallop  Breasts:   normal without suspicious masses, skin or nipple changes or axillary nodes  Abdomen:  normal findings: no organomegaly, soft, non-tender and no hernia  Pelvis:  External genitalia: normal general appearance Urinary system: urethral meatus normal and bladder without fullness, nontender Vaginal: normal without tenderness, induration or masses.  Cheesy white discharge. Cervix: normal appearance Adnexa: normal bimanual exam Uterus: anteverted and non-tender, normal size      Data Reviewed Labs  Assessment     Candida vaginitis Dysmenorrhea  Contraceptive Surveillance.  Pleased with Depo Provera    Plan    Diflucan Rx Tramadol Rx Depo Provera Rx F/U prn  No orders of the defined types were placed in this encounter.   Meds ordered this encounter  Medications  . traMADol (ULTRAM) 50 MG tablet    Sig: Take 2 tablets (100 mg total) by mouth every 6 (six) hours as needed for moderate pain or severe pain.    Dispense:  40 tablet    Refill:  2  . medroxyPROGESTERone (DEPO-PROVERA) 150 MG/ML injection    Sig: Inject 1 mL (150 mg total) into the muscle every 3 (three) months.    Dispense:  1 mL    Refill:  3  . fluconazole (DIFLUCAN) 150 MG tablet    Sig: Take 1 tablet (150 mg total) by mouth once.    Dispense:  1 tablet    Refill:  2

## 2016-03-23 ENCOUNTER — Other Ambulatory Visit: Payer: Self-pay | Admitting: Obstetrics

## 2016-03-23 LAB — NUSWAB VG+, CANDIDA 6SP
CANDIDA GLABRATA, NAA: NEGATIVE
CANDIDA TROPICALIS, NAA: NEGATIVE
Candida albicans, NAA: POSITIVE — AB
Candida krusei, NAA: NEGATIVE
Candida lusitaniae, NAA: NEGATIVE
Candida parapsilosis, NAA: NEGATIVE
Chlamydia trachomatis, NAA: NEGATIVE
Neisseria gonorrhoeae, NAA: NEGATIVE
Trich vag by NAA: NEGATIVE

## 2016-04-28 ENCOUNTER — Ambulatory Visit (INDEPENDENT_AMBULATORY_CARE_PROVIDER_SITE_OTHER): Payer: Medicaid Other | Admitting: *Deleted

## 2016-04-28 DIAGNOSIS — Z3042 Encounter for surveillance of injectable contraceptive: Secondary | ICD-10-CM

## 2016-04-28 MED ORDER — MEDROXYPROGESTERONE ACETATE 150 MG/ML IM SUSP
150.0000 mg | Freq: Once | INTRAMUSCULAR | Status: AC
  Administered 2016-04-28: 150 mg via INTRAMUSCULAR

## 2016-04-28 NOTE — Patient Instructions (Signed)
Pt advised to RTO 07/20/16 for the next injection.  She voiced understanding.

## 2016-04-30 ENCOUNTER — Other Ambulatory Visit: Payer: Self-pay | Admitting: Pediatrics

## 2016-04-30 DIAGNOSIS — N946 Dysmenorrhea, unspecified: Secondary | ICD-10-CM

## 2016-04-30 MED ORDER — TRAMADOL HCL 50 MG PO TABS: 100.0000 mg | ORAL_TABLET | Freq: Four times a day (QID) | ORAL | 2 refills | Status: DC | PRN

## 2016-04-30 NOTE — Telephone Encounter (Signed)
Patient called, was advised rx is ready for pick up and to bring photo id. She voiced understanding.

## 2016-05-17 ENCOUNTER — Encounter: Payer: Self-pay | Admitting: Obstetrics

## 2016-05-17 ENCOUNTER — Ambulatory Visit (INDEPENDENT_AMBULATORY_CARE_PROVIDER_SITE_OTHER): Payer: Medicaid Other | Admitting: Obstetrics

## 2016-05-17 VITALS — BP 121/74 | HR 107 | Temp 98.6°F | Ht 63.0 in | Wt 140.5 lb

## 2016-05-17 DIAGNOSIS — F418 Other specified anxiety disorders: Secondary | ICD-10-CM

## 2016-05-17 DIAGNOSIS — Z0000 Encounter for general adult medical examination without abnormal findings: Secondary | ICD-10-CM | POA: Diagnosis not present

## 2016-05-17 DIAGNOSIS — Z3042 Encounter for surveillance of injectable contraceptive: Secondary | ICD-10-CM

## 2016-05-17 DIAGNOSIS — Z01419 Encounter for gynecological examination (general) (routine) without abnormal findings: Secondary | ICD-10-CM

## 2016-05-17 DIAGNOSIS — Z124 Encounter for screening for malignant neoplasm of cervix: Secondary | ICD-10-CM

## 2016-05-17 MED ORDER — PAROXETINE HCL 20 MG PO TABS: 20.0000 mg | ORAL_TABLET | Freq: Every day | ORAL | 11 refills | Status: DC

## 2016-05-17 NOTE — Progress Notes (Signed)
Subjective:        Carmen Noble is a 30 y.o. female here for a routine exam.  Current complaints: Anxiety after break up with partner.   Still having periods with Depo Provera.  Personal health questionnaire:  Is patient Ashkenazi Jewish, have a family history of breast and/or ovarian cancer: no Is there a family history of uterine cancer diagnosed at age < 3050, gastrointestinal cancer, urinary tract cancer, family member who is a Personnel officerLynch syndrome-associated carrier: no Is the patient overweight and hypertensive, family history of diabetes, personal history of gestational diabetes, preeclampsia or PCOS: no Is patient over 6455, have PCOS,  family history of premature CHD under age 31, diabetes, smoke, have hypertension or peripheral artery disease:  no At any time, has a partner hit, kicked or otherwise hurt or frightened you?: no Over the past 2 weeks, have you felt down, depressed or hopeless?: no Over the past 2 weeks, have you felt little interest or pleasure in doing things?:no   Gynecologic History Patient's last menstrual period was 05/06/2016. Contraception: Depo-Provera injections Last Pap: 2016. Results were: normal Last mammogram: n/a. Results were: n/a  Obstetric History OB History  Gravida Para Term Preterm AB Living  4 3 3   1 3   SAB TAB Ectopic Multiple Live Births    1     3    # Outcome Date GA Lbr Len/2nd Weight Sex Delivery Anes PTL Lv  4 Term 10/22/14 [id number]  5 lb 15 oz (2.693 kg) F Vag-Spont None N LIV  3 Term 09/23/08    F Vag-Spont  N LIV  2 Term 09/14/04    M Vag-Spont  N LIV  1 TAB 1998              Past Medical History:  Diagnosis Date  . Depression    on zoloft  . Hypertension   . Migraine     Past Surgical History:  Procedure Laterality Date  . ANKLE SURGERY    . APPENDECTOMY    . APPENDECTOMY  2008  . HERNIA REPAIR    . HERNIA REPAIR  2010  . MOUTH SURGERY       Current Outpatient Prescriptions:  .  acetaminophen (TYLENOL) 325 MG  tablet, Take 650 mg by mouth every 6 (six) hours as needed for headache. Reported on 09/12/2015, Disp: , Rfl:  .  albuterol (PROVENTIL HFA;VENTOLIN HFA) 108 (90 BASE) MCG/ACT inhaler, Inhale 2 puffs into the lungs every 6 (six) hours as needed for wheezing or shortness of breath., Disp: 1 Inhaler, Rfl: 1 .  ibuprofen (ADVIL,MOTRIN) 800 MG tablet, Take 1 tablet (800 mg total) by mouth every 8 (eight) hours as needed., Disp: 30 tablet, Rfl: 5 .  medroxyPROGESTERone (DEPO-PROVERA) 150 MG/ML injection, Inject 1 mL (150 mg total) into the muscle every 3 (three) months., Disp: 1 mL, Rfl: 3 .  nortriptyline (PAMELOR) 25 MG capsule, Take 1 capsule (25 mg total) by mouth at bedtime., Disp: 30 capsule, Rfl: 2 .  Prenat-FePoly-Metf-FA-DHA-DSS (VITAFOL FE+) 90-1-200 & 50 MG CPPK, Take 1 capsule by mouth daily before breakfast., Disp: 60 each, Rfl: 11 .  traMADol (ULTRAM) 50 MG tablet, Take 2 tablets (100 mg total) by mouth every 6 (six) hours as needed for moderate pain or severe pain., Disp: 40 tablet, Rfl: 2  Current Facility-Administered Medications:  .  medroxyPROGESTERone (DEPO-PROVERA) injection 150 mg, 150 mg, Intramuscular, Q90 days, Brock Badharles A Napoleon Monacelli, MD, 150 mg at 11/13/15 1345 Allergies  Allergen Reactions  .  Bee Venom Anaphylaxis  . Penicillins Anaphylaxis    Social History  Substance Use Topics  . Smoking status: Current Every Day Smoker    Packs/day: 0.50    Years: 15.00    Types: Cigarettes  . Smokeless tobacco: Never Used     Comment: thinking about it  . Alcohol use No    Family History  Problem Relation Age of Onset  . Diabetes Father       Review of Systems  Constitutional: negative for fatigue and weight loss Respiratory: negative for cough and wheezing Cardiovascular: negative for chest pain, fatigue and palpitations Gastrointestinal: negative for abdominal pain and change in bowel habits Musculoskeletal:negative for myalgias Neurological: negative for gait problems and  tremors Behavioral/Psych: positive for anxiety / depression Endocrine: negative for temperature intolerance   Genitourinary:negative for abnormal menstrual periods, genital lesions, hot flashes, sexual problems and vaginal discharge Integument/breast: negative for breast lump, breast tenderness, nipple discharge and skin lesion(s)    Objective:       BP 121/74   Pulse (!) 107   Temp 98.6 F (37 C) (Oral)   Ht 5\' 3"  (1.6 m)   Wt 140 lb 8 oz (63.7 kg)   LMP 05/06/2016   Breastfeeding? No   BMI 24.89 kg/m  General:   alert  Skin:   no rash or abnormalities  Lungs:   clear to auscultation bilaterally  Heart:   regular rate and rhythm, S1, S2 normal, no murmur, click, rub or gallop  Breasts:   normal without suspicious masses, skin or nipple changes or axillary nodes  Abdomen:  normal findings: no organomegaly, soft, non-tender and no hernia  Pelvis:  External genitalia: normal general appearance Urinary system: urethral meatus normal and bladder without fullness, nontender Vaginal: normal without tenderness, induration or masses Cervix: normal appearance Adnexa: normal bimanual exam Uterus: anteverted and non-tender, normal size   Lab Review Urine pregnancy test Labs reviewed yes Radiologic studies reviewed no  50% of 20 min visit spent on counseling and coordination of care.   Assessment:    Healthy female exam.    Contraceptive surveillance.  Pleased with Depo Provera except for periods that are normal.  Did not have periods previously to childbirth and restarting  Depo.  Anxiety / Depression  Plan:    Paxil Rx  Education reviewed: calcium supplements, depression evaluation, low fat, low cholesterol diet, safe sex/STD prevention, self breast exams, smoking cessation and weight bearing exercise. Contraception: Depo-Provera injections. Follow up in: 3 months.   No orders of the defined types were placed in this encounter.  No orders of the defined types were placed  in this encounter.     Patient ID: Carmen Noble, female   DOB: 1986-07-28, 29 y.o.   MRN: [id number]

## 2016-05-17 NOTE — Addendum Note (Signed)
Addended by: Coral CeoHARPER, CHARLES A on: 05/17/2016 04:07 PM   Modules accepted: Orders

## 2016-05-21 LAB — NUSWAB VG+, CANDIDA 6SP
CANDIDA ALBICANS, NAA: NEGATIVE
CANDIDA PARAPSILOSIS, NAA: NEGATIVE
CHLAMYDIA TRACHOMATIS, NAA: NEGATIVE
Candida glabrata, NAA: NEGATIVE
Candida krusei, NAA: NEGATIVE
Candida lusitaniae, NAA: NEGATIVE
Candida tropicalis, NAA: NEGATIVE
NEISSERIA GONORRHOEAE, NAA: NEGATIVE
Trich vag by NAA: NEGATIVE

## 2016-05-21 LAB — PAP IG W/ RFLX HPV ASCU: PAP Smear Comment: 0

## 2016-05-25 ENCOUNTER — Telehealth: Payer: Self-pay | Admitting: *Deleted

## 2016-05-25 NOTE — Telephone Encounter (Signed)
Pt called to office asking for refill on her Tramadol Rx. Pt advised that request will be sent to provider for recommendations. Pt made aware she will be notified once reviewed by provider.   Please advise on refill.

## 2016-06-15 ENCOUNTER — Telehealth: Payer: Self-pay | Admitting: *Deleted

## 2016-06-15 NOTE — Telephone Encounter (Signed)
Patient is requesting a refill on her Tramadol- she states she is having really bad cramping and pain.

## 2016-06-24 ENCOUNTER — Telehealth: Payer: Self-pay | Admitting: *Deleted

## 2016-06-24 NOTE — Telephone Encounter (Signed)
Patient is calling requesting a refill of her Tramadol

## 2016-07-09 ENCOUNTER — Encounter: Payer: Self-pay | Admitting: Obstetrics

## 2016-07-09 ENCOUNTER — Ambulatory Visit (INDEPENDENT_AMBULATORY_CARE_PROVIDER_SITE_OTHER): Payer: Medicaid Other | Admitting: Obstetrics

## 2016-07-09 VITALS — BP 131/77 | HR 101 | Temp 98.0°F | Wt 144.0 lb

## 2016-07-09 DIAGNOSIS — R102 Pelvic and perineal pain: Secondary | ICD-10-CM

## 2016-07-09 DIAGNOSIS — N946 Dysmenorrhea, unspecified: Secondary | ICD-10-CM

## 2016-07-09 DIAGNOSIS — G8929 Other chronic pain: Secondary | ICD-10-CM

## 2016-07-09 DIAGNOSIS — R1031 Right lower quadrant pain: Secondary | ICD-10-CM

## 2016-07-09 DIAGNOSIS — R1032 Left lower quadrant pain: Secondary | ICD-10-CM

## 2016-07-09 MED ORDER — TRAMADOL HCL 50 MG PO TABS: 100.0000 mg | ORAL_TABLET | Freq: Four times a day (QID) | ORAL | 2 refills | Status: DC | PRN

## 2016-07-09 NOTE — Progress Notes (Signed)
Patient ID: Carmen Noble, female   DOB: 1985/12/25, 30 y.o.   MRN: [id number]  Chief Complaint  Patient presents with  . Advice Only    Tramadol refill, hernia implant 2010    HPI Carmen Noble is a 30 y.o. female.  Continues to have lower abdominal pain.  Denies constipation, diarrhea, nausea or vomiting.  Has a history of gastric ulcers. HPI  Past Medical History:  Diagnosis Date  . Depression    on zoloft  . Hypertension   . Migraine     Past Surgical History:  Procedure Laterality Date  . ANKLE SURGERY    . APPENDECTOMY    . APPENDECTOMY  2008  . HERNIA REPAIR    . HERNIA REPAIR  2010  . MOUTH SURGERY      Family History  Problem Relation Age of Onset  . Diabetes Father     Social History Social History  Substance Use Topics  . Smoking status: Current Every Day Smoker    Packs/day: 0.50    Years: 15.00    Types: Cigarettes  . Smokeless tobacco: Never Used     Comment: thinking about it  . Alcohol use No    Allergies  Allergen Reactions  . Bee Venom Anaphylaxis  . Penicillins Anaphylaxis    Current Outpatient Prescriptions  Medication Sig Dispense Refill  . acetaminophen (TYLENOL) 325 MG tablet Take 650 mg by mouth every 6 (six) hours as needed for headache. Reported on 09/12/2015    . albuterol (PROVENTIL HFA;VENTOLIN HFA) 108 (90 BASE) MCG/ACT inhaler Inhale 2 puffs into the lungs every 6 (six) hours as needed for wheezing or shortness of breath. 1 Inhaler 1  . ibuprofen (ADVIL,MOTRIN) 800 MG tablet Take 1 tablet (800 mg total) by mouth every 8 (eight) hours as needed. 30 tablet 5  . medroxyPROGESTERone (DEPO-PROVERA) 150 MG/ML injection Inject 1 mL (150 mg total) into the muscle every 3 (three) months. 1 mL 3  . nortriptyline (PAMELOR) 25 MG capsule Take 1 capsule (25 mg total) by mouth at bedtime. 30 capsule 2  . PARoxetine (PAXIL) 20 MG tablet Take 1 tablet (20 mg total) by mouth daily. 30 tablet 11  . Prenat-FePoly-Metf-FA-DHA-DSS (VITAFOL  FE+) 90-1-200 & 50 MG CPPK Take 1 capsule by mouth daily before breakfast. 60 each 11  . traMADol (ULTRAM) 50 MG tablet Take 2 tablets (100 mg total) by mouth every 6 (six) hours as needed for moderate pain or severe pain. 40 tablet 2   Current Facility-Administered Medications  Medication Dose Route Frequency Provider Last Rate Last Dose  . medroxyPROGESTERone (DEPO-PROVERA) injection 150 mg  150 mg Intramuscular Q90 days Brock Badharles A Amere Iott, MD   150 mg at 11/13/15 1345    Review of Systems Review of Systems Constitutional: negative for fatigue and weight loss Respiratory: negative for cough and wheezing Cardiovascular: negative for chest pain, fatigue and palpitations Gastrointestinal: positive for abdominal pain,  and negative for change in bowel habits Genitourinary:negative Integument/breast: negative for nipple discharge Musculoskeletal:negative for myalgias Neurological: negative for gait problems and tremors Behavioral/Psych: negative for abusive relationship, depression Endocrine: negative for temperature intolerance      Blood pressure 131/77, pulse (!) 101, temperature 98 F (36.7 C), temperature source Oral, weight 144 lb (65.3 kg), not currently breastfeeding.  Physical Exam Physical Exam:  Deferred  >50% of 10 min visit spent on counseling and coordination of care.    Data Reviewed Labs  Assessment     Chronic lower abdominal pain History  of GI Ulcers    Plan     Pelvic ultrasound ordered  Tramadol Rx  Referred to Internal Medicine for General Medical Care and follow up of ulcers.  F/U in 2 weeks for results  No orders of the defined types were placed in this encounter.  No orders of the defined types were placed in this encounter.

## 2016-07-15 ENCOUNTER — Emergency Department (HOSPITAL_COMMUNITY): Payer: Medicaid Other

## 2016-07-15 ENCOUNTER — Emergency Department (HOSPITAL_COMMUNITY)
Admission: EM | Admit: 2016-07-15 | Discharge: 2016-07-15 | Disposition: A | Discharge status: Home / Self Care | Payer: Medicaid Other | Attending: Emergency Medicine | Admitting: Emergency Medicine

## 2016-07-15 ENCOUNTER — Encounter (HOSPITAL_COMMUNITY): Payer: Self-pay | Admitting: Emergency Medicine

## 2016-07-15 DIAGNOSIS — R05 Cough: Secondary | ICD-10-CM

## 2016-07-15 DIAGNOSIS — J40 Bronchitis, not specified as acute or chronic: Secondary | ICD-10-CM | POA: Insufficient documentation

## 2016-07-15 DIAGNOSIS — I10 Essential (primary) hypertension: Secondary | ICD-10-CM | POA: Insufficient documentation

## 2016-07-15 DIAGNOSIS — F17210 Nicotine dependence, cigarettes, uncomplicated: Secondary | ICD-10-CM | POA: Diagnosis not present

## 2016-07-15 DIAGNOSIS — R059 Cough, unspecified: Secondary | ICD-10-CM

## 2016-07-15 MED ORDER — ALBUTEROL SULFATE HFA 108 (90 BASE) MCG/ACT IN AERS
2.0000 | INHALATION_SPRAY | Freq: Once | RESPIRATORY_TRACT | Status: AC
  Administered 2016-07-15: 2 via RESPIRATORY_TRACT
  Filled 2016-07-15: qty 6.7

## 2016-07-15 MED ORDER — IPRATROPIUM-ALBUTEROL 0.5-2.5 (3) MG/3ML IN SOLN
3.0000 mL | Freq: Once | RESPIRATORY_TRACT | Status: AC
  Administered 2016-07-15: 3 mL via RESPIRATORY_TRACT
  Filled 2016-07-15: qty 3

## 2016-07-15 NOTE — ED Notes (Signed)
Papers reviewed with patient and she demonstrates understanding of how to use the inhaler

## 2016-07-15 NOTE — ED Notes (Signed)
Breathing treatment happening

## 2016-07-15 NOTE — ED Provider Notes (Signed)
[facility]-EMERGENCY DEPT Provider Note   CSN: [id number] Arrival date & time: 07/15/16  0906     History   Chief Complaint Chief Complaint  Patient presents with  . Cough  . Shortness of Breath    HPI Carmen Noble is a 30 y.o. female.  The history is provided by the patient.  Cough  This is a new problem. Episode onset: 2 weeks. The problem occurs constantly. The problem has been gradually worsening. The cough is productive of sputum. There has been no fever. Associated symptoms include rhinorrhea and shortness of breath. Pertinent negatives include no chills. She is a smoker. Her past medical history is significant for bronchitis. Her past medical history does not include pneumonia.  Shortness of Breath  Associated symptoms include rhinorrhea and cough. Associated medical issues do not include pneumonia.    Past Medical History:  Diagnosis Date  . Depression    on zoloft  . Hypertension   . Migraine     Patient Active Problem List   Diagnosis Date Noted  . Chronic tension-type headache, intractable 05/30/2015  . Tobacco use disorder 05/30/2015  . Pregnancy with abdominal pain of left upper quadrant, antepartum   . [redacted] weeks gestation of pregnancy   . Chlamydial cervicitis 04/04/2014  . Dysmenorrhea 06/27/2013  . Other and unspecified ovarian cyst 06/27/2013    Past Surgical History:  Procedure Laterality Date  . ANKLE SURGERY    . APPENDECTOMY    . APPENDECTOMY  2008  . HERNIA REPAIR    . HERNIA REPAIR  2010  . MOUTH SURGERY      OB History    Gravida Para Term Preterm AB Living   4 3 3   1 3    SAB TAB Ectopic Multiple Live Births     1     3       Home Medications    Prior to Admission medications   Medication Sig Start Date End Date Taking? Authorizing Provider  acetaminophen (TYLENOL) 325 MG tablet Take 650 mg by mouth every 6 (six) hours as needed for headache. Reported on 09/12/2015    Historical Provider, MD  albuterol (PROVENTIL HFA;VENTOLIN  HFA) 108 (90 BASE) MCG/ACT inhaler Inhale 2 puffs into the lungs every 6 (six) hours as needed for wheezing or shortness of breath. 05/22/14   Marny LowensteinJulie N Wenzel, PA-C  ibuprofen (ADVIL,MOTRIN) 800 MG tablet Take 1 tablet (800 mg total) by mouth every 8 (eight) hours as needed. 09/12/15   Brock Badharles A Harper, MD  medroxyPROGESTERone (DEPO-PROVERA) 150 MG/ML injection Inject 1 mL (150 mg total) into the muscle every 3 (three) months. 03/18/16   Brock Badharles A Harper, MD  nortriptyline (PAMELOR) 25 MG capsule Take 1 capsule (25 mg total) by mouth at bedtime. 05/30/15   Drema DallasAdam R Jaffe, DO  PARoxetine (PAXIL) 20 MG tablet Take 1 tablet (20 mg total) by mouth daily. 05/17/16   Brock Badharles A Harper, MD  Prenat-FePoly-Metf-FA-DHA-DSS (VITAFOL FE+) 90-1-200 & 50 MG CPPK Take 1 capsule by mouth daily before breakfast. 09/12/15   Brock Badharles A Harper, MD  traMADol (ULTRAM) 50 MG tablet Take 2 tablets (100 mg total) by mouth every 6 (six) hours as needed for moderate pain or severe pain. 07/09/16   Brock Badharles A Harper, MD    Family History Family History  Problem Relation Age of Onset  . Diabetes Father     Social History Social History  Substance Use Topics  . Smoking status: Current Every Day Smoker    Packs/day:  0.50    Years: 15.00    Types: Cigarettes  . Smokeless tobacco: Never Used     Comment: thinking about it  . Alcohol use No     Allergies   Bee venom and Penicillins   Review of Systems Review of Systems  Constitutional: Negative for chills.  HENT: Positive for rhinorrhea.   Respiratory: Positive for cough and shortness of breath.   Ten systems are reviewed and are negative for acute change except as noted in the HPI    Physical Exam Updated Vital Signs BP 141/80 (BP Location: Right Arm)   Pulse 78   Temp 97.9 F (36.6 C) (Oral)   Resp 18   SpO2 100%   Physical Exam  Constitutional: She is oriented to person, place, and time. She appears well-developed and well-nourished. No distress.  HENT:    Head: Normocephalic and atraumatic.  Nose: Nose normal.  Eyes: Conjunctivae and EOM are normal. Pupils are equal, round, and reactive to light. Right eye exhibits no discharge. Left eye exhibits no discharge. No scleral icterus.  Neck: Normal range of motion. Neck supple.  Cardiovascular: Normal rate and regular rhythm.  Exam reveals no gallop and no friction rub.   No murmur heard. Pulmonary/Chest: Effort normal. No stridor. No respiratory distress. She has no decreased breath sounds. She has rales in the right lower field and the left lower field.  Abdominal: Soft. She exhibits no distension. There is no tenderness.  Musculoskeletal: She exhibits no edema or tenderness.  Neurological: She is alert and oriented to person, place, and time.  Skin: Skin is warm and dry. No rash noted. She is not diaphoretic. No erythema.  Psychiatric: She has a normal mood and affect.  Vitals reviewed.    ED Treatments / Results  Labs (all labs ordered are listed, but only abnormal results are displayed) Labs Reviewed - No data to display  EKG  EKG Interpretation None       Radiology Dg Chest 2 View  Result Date: 07/15/2016 CLINICAL DATA:  Cough and shortness of breath for 2 weeks. EXAM: CHEST  2 VIEW COMPARISON:  05/22/2014 FINDINGS: The cardiomediastinal silhouette is unremarkable. There is no evidence of focal airspace disease, pulmonary edema, suspicious pulmonary nodule/mass, pleural effusion, or pneumothorax. No acute bony abnormalities are identified. IMPRESSION: No active cardiopulmonary disease. Electronically Signed   By: Harmon PierJeffrey  Hu M.D.   On: 07/15/2016 10:59    Procedures Procedures (including critical care time)  Medications Ordered in ED Medications  albuterol (PROVENTIL HFA;VENTOLIN HFA) 108 (90 Base) MCG/ACT inhaler 2 puff (not administered)  ipratropium-albuterol (DUONEB) 0.5-2.5 (3) MG/3ML nebulizer solution 3 mL (3 mLs Nebulization Given 07/15/16 0928)     Initial  Impression / Assessment and Plan / ED Course  I have reviewed the triage vital signs and the nursing notes.  Pertinent labs & imaging results that were available during my care of the patient were reviewed by me and considered in my medical decision making (see chart for details).  Clinical Course     No evidence of PNA on CXR. Breathing improved following breathing treatment. Likely bronchitis. Low suspicion for PE.   The patient is safe for discharge with strict return precautions.   Final Clinical Impressions(s) / ED Diagnoses   Final diagnoses:  Cough  Bronchitis   Disposition: Discharge  Condition: Good  I have discussed the results, Dx and Tx plan with the patient who expressed understanding and agree(s) with the plan. Discharge instructions discussed at great length.  The patient was given strict return precautions who verbalized understanding of the instructions. No further questions at time of discharge.    Current Discharge Medication List      Follow Up: [facility] [address] [place] [address] [phone] Call  For help establishing care with a care provider      Nira Conn, MD 07/15/16 1118

## 2016-07-15 NOTE — ED Notes (Signed)
Placed patient into a gown and on the monitor 

## 2016-07-15 NOTE — ED Triage Notes (Signed)
Pt sts cough and pain in right side back area with cough and inspiration; pt sts asthma sx in past and seems similar

## 2016-07-20 ENCOUNTER — Ambulatory Visit (HOSPITAL_COMMUNITY)
Admission: RE | Admit: 2016-07-20 | Discharge: 2016-07-20 | Disposition: A | Discharge status: Home / Self Care | Payer: Medicaid Other | Source: Ambulatory Visit | Attending: Obstetrics | Admitting: Obstetrics

## 2016-07-20 ENCOUNTER — Ambulatory Visit (INDEPENDENT_AMBULATORY_CARE_PROVIDER_SITE_OTHER): Payer: Medicaid Other | Admitting: *Deleted

## 2016-07-20 VITALS — BP 134/93 | HR 92 | Wt 141.0 lb

## 2016-07-20 DIAGNOSIS — Z3042 Encounter for surveillance of injectable contraceptive: Secondary | ICD-10-CM

## 2016-07-20 DIAGNOSIS — R102 Pelvic and perineal pain: Secondary | ICD-10-CM | POA: Insufficient documentation

## 2016-07-20 DIAGNOSIS — G8929 Other chronic pain: Secondary | ICD-10-CM

## 2016-07-20 MED ORDER — MEDROXYPROGESTERONE ACETATE 150 MG/ML IM SUSP
150.0000 mg | Freq: Once | INTRAMUSCULAR | Status: AC
  Administered 2016-07-20: 150 mg via INTRAMUSCULAR

## 2016-07-27 ENCOUNTER — Ambulatory Visit: Payer: Medicaid Other | Admitting: Obstetrics

## 2016-08-04 ENCOUNTER — Ambulatory Visit (INDEPENDENT_AMBULATORY_CARE_PROVIDER_SITE_OTHER): Payer: Medicaid Other | Admitting: Obstetrics

## 2016-08-04 VITALS — BP 128/78 | HR 93 | Temp 98.1°F

## 2016-08-04 DIAGNOSIS — G8929 Other chronic pain: Secondary | ICD-10-CM

## 2016-08-04 DIAGNOSIS — R102 Pelvic and perineal pain: Secondary | ICD-10-CM

## 2016-08-05 ENCOUNTER — Encounter: Payer: Self-pay | Admitting: Obstetrics

## 2016-08-05 NOTE — Progress Notes (Signed)
Patient ID: Carmen Noble, female   DOB: [birth year], 30 y.o.   MRN: [id number]  Chief Complaint  Patient presents with  . Follow-up    u/s results    HPI Carmen Noble is a 30 y.o. female.  Presents for ultrasound results.  Continues to have pelvic pain.  The pain is in the area of a previous abdominal hernia repair. HPI  Past Medical History:  Diagnosis Date  . Depression    on zoloft  . Hypertension   . Migraine     Past Surgical History:  Procedure Laterality Date  . ANKLE SURGERY    . APPENDECTOMY    . APPENDECTOMY  2008  . HERNIA REPAIR    . HERNIA REPAIR  2010  . MOUTH SURGERY      Family History  Problem Relation Age of Onset  . Diabetes Father     Social History Social History  Substance Use Topics  . Smoking status: Current Every Day Smoker    Packs/day: 0.50    Years: 15.00    Types: Cigarettes  . Smokeless tobacco: Never Used     Comment: thinking about it  . Alcohol use No    Allergies  Allergen Reactions  . Bee Venom Anaphylaxis  . Penicillins Anaphylaxis    Current Outpatient Prescriptions  Medication Sig Dispense Refill  . acetaminophen (TYLENOL) 325 MG tablet Take 650 mg by mouth every 6 (six) hours as needed for headache. Reported on 09/12/2015    . albuterol (PROVENTIL HFA;VENTOLIN HFA) 108 (90 BASE) MCG/ACT inhaler Inhale 2 puffs into the lungs every 6 (six) hours as needed for wheezing or shortness of breath. 1 Inhaler 1  . ibuprofen (ADVIL,MOTRIN) 800 MG tablet Take 1 tablet (800 mg total) by mouth every 8 (eight) hours as needed. 30 tablet 5  . medroxyPROGESTERone (DEPO-PROVERA) 150 MG/ML injection Inject 1 mL (150 mg total) into the muscle every 3 (three) months. 1 mL 3  . nortriptyline (PAMELOR) 25 MG capsule Take 1 capsule (25 mg total) by mouth at bedtime. 30 capsule 2  . PARoxetine (PAXIL) 20 MG tablet Take 1 tablet (20 mg total) by mouth daily. 30 tablet 11  . Prenat-FePoly-Metf-FA-DHA-DSS (VITAFOL FE+) 90-1-200 & 50 MG CPPK  Take 1 capsule by mouth daily before breakfast. 60 each 11  . traMADol (ULTRAM) 50 MG tablet Take 2 tablets (100 mg total) by mouth every 6 (six) hours as needed for moderate pain or severe pain. 30 tablet 2   Current Facility-Administered Medications  Medication Dose Route Frequency Provider Last Rate Last Dose  . medroxyPROGESTERone (DEPO-PROVERA) injection 150 mg  150 mg Intramuscular Q90 days Brock Badharles A Harper, MD   150 mg at 11/13/15 1345    Review of Systems Review of Systems Constitutional: negative for fatigue and weight loss Respiratory: negative for cough and wheezing Cardiovascular: negative for chest pain, fatigue and palpitations Gastrointestinal: positive for abdominal pain and negative forchange in bowel habits Genitourinary:positive for pelvic pain Integument/breast: negative for nipple discharge Musculoskeletal:negative for myalgias Neurological: negative for gait problems and tremors Behavioral/Psych: negative for abusive relationship, depression Endocrine: negative for temperature intolerance      Blood pressure 128/78, pulse 93, temperature 98.1 F (36.7 C), temperature source Oral, last menstrual period 07/15/2016, not currently breastfeeding.  Physical Exam Physical Exam:  Deferred  >50% of 10 min visit spent on counseling and coordination of care.    Data Reviewed Ultrasound:  Normal uterus, ovaries and tubes  Assessment     Chronic  Pelvic Pain.  Negative pelvic ultrasound findings. Pain possibly related to previous hernia repair.   Plan:    Patient to contact General Surgery for re-evaluation of hernia repair. F/U prn.  No orders of the defined types were placed in this encounter.  No orders of the defined types were placed in this encounter.

## 2016-09-09 ENCOUNTER — Telehealth: Payer: Self-pay | Admitting: *Deleted

## 2016-09-09 NOTE — Telephone Encounter (Signed)
Pt called to office for refill on Tramadol.  Last ordered 06/2016. Pt made aware request sent to provider.   Please send refill if approved.

## 2016-10-11 ENCOUNTER — Ambulatory Visit (INDEPENDENT_AMBULATORY_CARE_PROVIDER_SITE_OTHER): Payer: Medicaid Other | Admitting: *Deleted

## 2016-10-11 ENCOUNTER — Ambulatory Visit: Payer: Medicaid Other

## 2016-10-11 VITALS — BP 128/78 | HR 80 | Wt 146.6 lb

## 2016-10-11 DIAGNOSIS — Z3042 Encounter for surveillance of injectable contraceptive: Secondary | ICD-10-CM | POA: Diagnosis not present

## 2016-10-11 MED ORDER — MEDROXYPROGESTERONE ACETATE 150 MG/ML IM SUSP
150.0000 mg | Freq: Once | INTRAMUSCULAR | Status: AC
  Administered 2016-10-11: 150 mg via INTRAMUSCULAR

## 2016-10-20 ENCOUNTER — Telehealth: Payer: Self-pay

## 2016-10-20 NOTE — Telephone Encounter (Signed)
Returned call and patient stated that she has been on Depo injection since 2016 and has had irregular bleeding since, but it is now worse in addition to cramping. Advised patient that I would route to provider for further review and follow up with her.

## 2016-10-21 NOTE — Telephone Encounter (Signed)
Stop Depo.

## 2016-10-22 ENCOUNTER — Telehealth: Payer: Self-pay

## 2016-10-22 NOTE — Telephone Encounter (Signed)
Spoke with patient and advised that provider said that she should stop the Depo injections, patient stated that she would call back to change her appt.

## 2016-10-26 ENCOUNTER — Other Ambulatory Visit: Payer: Self-pay | Admitting: Obstetrics

## 2016-10-26 DIAGNOSIS — N946 Dysmenorrhea, unspecified: Secondary | ICD-10-CM

## 2016-10-27 ENCOUNTER — Other Ambulatory Visit: Payer: Self-pay | Admitting: Obstetrics

## 2016-10-27 DIAGNOSIS — N946 Dysmenorrhea, unspecified: Secondary | ICD-10-CM

## 2016-11-10 ENCOUNTER — Ambulatory Visit: Payer: Medicaid Other | Admitting: Obstetrics

## 2016-11-24 ENCOUNTER — Emergency Department (HOSPITAL_COMMUNITY)
Admission: EM | Admit: 2016-11-24 | Discharge: 2016-11-24 | Disposition: A | Discharge status: Home / Self Care | Payer: Medicaid Other | Attending: Emergency Medicine | Admitting: Emergency Medicine

## 2016-11-24 ENCOUNTER — Encounter (HOSPITAL_COMMUNITY): Payer: Self-pay | Admitting: Emergency Medicine

## 2016-11-24 DIAGNOSIS — N921 Excessive and frequent menstruation with irregular cycle: Secondary | ICD-10-CM

## 2016-11-24 DIAGNOSIS — N926 Irregular menstruation, unspecified: Secondary | ICD-10-CM | POA: Insufficient documentation

## 2016-11-24 DIAGNOSIS — I10 Essential (primary) hypertension: Secondary | ICD-10-CM | POA: Diagnosis not present

## 2016-11-24 DIAGNOSIS — N939 Abnormal uterine and vaginal bleeding, unspecified: Secondary | ICD-10-CM | POA: Diagnosis present

## 2016-11-24 DIAGNOSIS — F17210 Nicotine dependence, cigarettes, uncomplicated: Secondary | ICD-10-CM | POA: Diagnosis not present

## 2016-11-24 LAB — URINALYSIS, ROUTINE W REFLEX MICROSCOPIC
Bilirubin Urine: NEGATIVE
Glucose, UA: NEGATIVE mg/dL
HGB URINE DIPSTICK: NEGATIVE
KETONES UR: NEGATIVE mg/dL
Leukocytes, UA: NEGATIVE
NITRITE: NEGATIVE
Protein, ur: NEGATIVE mg/dL
SPECIFIC GRAVITY, URINE: 1.015 (ref 1.005–1.030)
pH: 6 (ref 5.0–8.0)

## 2016-11-24 LAB — WET PREP, GENITAL
Clue Cells Wet Prep HPF POC: NONE SEEN
SPERM: NONE SEEN
TRICH WET PREP: NONE SEEN
YEAST WET PREP: NONE SEEN

## 2016-11-24 LAB — POC URINE PREG, ED: Preg Test, Ur: NEGATIVE

## 2016-11-24 NOTE — ED Triage Notes (Signed)
Pt sts increased vaginal bleeding and abd pain since having depo in February

## 2016-11-24 NOTE — Discharge Instructions (Signed)
Take 4 over the counter ibuprofen tablets 3 times a day or 2 over-the-counter naproxen tablets twice a day for pain. Also take tylenol 1000mg(2 extra strength) four times a day.    

## 2016-11-24 NOTE — ED Provider Notes (Signed)
[facility]-EMERGENCY DEPT Provider Note   CSN: [id number] Arrival date & time: 11/24/16  1208  By signing my name below, I, Carmen Noble, attest that this documentation has been prepared under the direction and in the presence of Carmen Plan, DO . Electronically Signed: Teofilo Noble, ED Scribe. 11/24/2016. 2:06 PM.    History   Chief Complaint Chief Complaint  Patient presents with  . Vaginal Bleeding    The history is provided by the patient. No language interpreter was used.   HPI Comments:  Carmen Noble is a 31 y.o. female who presents to the Emergency Department complaining of increased vaginal bleeding x 2 months. She states that 6 days ago she started bleeding heavily and has to change her tampon every 3-4 hours. Bleeding has been persistent for 6 days. Pt has been on Depo for 2 years, and had her last shot on 10/07/16, and had bleeding from 10/11/16, and bleeding has been intermittent since then. Pt complains of associated right sided back pain, and abdominal pain. She reports hx of appendectomy, and had a mesh hernia implant placed. No alleviating factors noted. Pt denies other associated symptoms.    Past Medical History:  Diagnosis Date  . Depression    on zoloft  . Hypertension   . Migraine     Patient Active Problem List   Diagnosis Date Noted  . Chronic tension-type headache, intractable 05/30/2015  . Tobacco use disorder 05/30/2015  . Pregnancy with abdominal pain of left upper quadrant, antepartum   . [redacted] weeks gestation of pregnancy   . Dysmenorrhea 06/27/2013  . Other and unspecified ovarian cyst 06/27/2013    Past Surgical History:  Procedure Laterality Date  . ANKLE SURGERY    . APPENDECTOMY    . APPENDECTOMY  2008  . HERNIA REPAIR    . HERNIA REPAIR  2010  . MOUTH SURGERY      OB History    Gravida Para Term Preterm AB Living   4 3 3   1 3    SAB TAB Ectopic Multiple Live Births     1     3       Home Medications    Prior to  Admission medications   Medication Sig Start Date End Date Taking? Authorizing Provider  acetaminophen (TYLENOL) 325 MG tablet Take 650 mg by mouth every 6 (six) hours as needed for headache. Reported on 09/12/2015    Historical Provider, MD  albuterol (PROVENTIL HFA;VENTOLIN HFA) 108 (90 BASE) MCG/ACT inhaler Inhale 2 puffs into the lungs every 6 (six) hours as needed for wheezing or shortness of breath. 05/22/14   Marny Lowenstein, PA-C  ibuprofen (ADVIL,MOTRIN) 800 MG tablet Take 1 tablet (800 mg total) by mouth every 8 (eight) hours as needed. 09/12/15   Brock Bad, MD  medroxyPROGESTERone (DEPO-PROVERA) 150 MG/ML injection Inject 1 mL (150 mg total) into the muscle every 3 (three) months. 03/18/16   Brock Bad, MD  nortriptyline (PAMELOR) 25 MG capsule Take 1 capsule (25 mg total) by mouth at bedtime. 05/30/15   Drema Dallas, DO  PARoxetine (PAXIL) 20 MG tablet Take 1 tablet (20 mg total) by mouth daily. 05/17/16   Brock Bad, MD  Prenat-FePoly-Metf-FA-DHA-DSS (VITAFOL FE+) 90-1-200 & 50 MG CPPK Take 1 capsule by mouth daily before breakfast. 09/12/15   Brock Bad, MD  traMADol (ULTRAM) 50 MG tablet Take 2 tablets (100 mg total) by mouth every 6 (six) hours as needed for moderate  pain or severe pain. 07/09/16   Brock Badharles A Harper, MD    Family History Family History  Problem Relation Age of Onset  . Diabetes Father     Social History Social History  Substance Use Topics  . Smoking status: Current Every Day Smoker    Packs/day: 0.50    Years: 15.00    Types: Cigarettes  . Smokeless tobacco: Never Used     Comment: thinking about it  . Alcohol use No     Allergies   Bee venom and Penicillins   Review of Systems Review of Systems  Gastrointestinal: Positive for abdominal distention.  Genitourinary: Positive for pelvic pain and vaginal bleeding.  Musculoskeletal: Positive for back pain.     Physical Exam Updated Vital Signs BP (!) 137/99 (BP Location: Left  Arm)   Pulse 91   Temp 98.5 F (36.9 C) (Oral)   Resp 18   SpO2 100%   Physical Exam  Constitutional: She is oriented to person, place, and time. She appears well-developed and well-nourished. No distress.  HENT:  Head: Normocephalic and atraumatic.  Eyes: EOM are normal. Pupils are equal, round, and reactive to light.  Neck: Normal range of motion. Neck supple.  Cardiovascular: Normal rate and regular rhythm.  Exam reveals no gallop and no friction rub.   No murmur heard. Pulmonary/Chest: Effort normal. She has no wheezes. She has no rales.  Abdominal: Soft. She exhibits no distension. There is no tenderness.  Genitourinary: Cervix exhibits no motion tenderness, no discharge and no friability. Right adnexum displays no mass, no tenderness and no fullness. Left adnexum displays no mass, no tenderness and no fullness.    Musculoskeletal: She exhibits no edema or tenderness.  Neurological: She is alert and oriented to person, place, and time.  Skin: Skin is warm and dry. She is not diaphoretic.  Psychiatric: She has a normal mood and affect. Her behavior is normal.  Nursing note and vitals reviewed.    ED Treatments / Results  DIAGNOSTIC STUDIES:  Oxygen Saturation is 100% on RA, normal by my interpretation.    COORDINATION OF CARE:  2:05 PM Will order UA and will perform pelvic exam. Discussed treatment Noble with pt at bedside and pt agreed to Noble.   Labs (all labs ordered are listed, but only abnormal results are displayed) Labs Reviewed  WET PREP, GENITAL - Abnormal; Notable for the following:       Result Value   WBC, Wet Prep HPF POC FEW (*)    All other components within normal limits  URINALYSIS, ROUTINE W REFLEX MICROSCOPIC  POC URINE PREG, ED  GC/CHLAMYDIA PROBE AMP ([facility]) [facility]    EKG  EKG Interpretation None       Radiology No results found.  Procedures Procedures (including critical care time)  Medications Ordered in  ED Medications - No data to display   Initial Impression / Assessment and Noble / ED Course  I have reviewed the triage vital signs and the nursing notes.  Pertinent labs & imaging results that were available during my care of the patient were reviewed by me and considered in my medical decision making (see chart for details).     31 yo F With a chief complaint of vaginal bleeding. This is been going on since she's been on the Depakote shot. She feels that she has been having significant bleeding where she has to have her tampon change every 3 hours. Pelvic exam with no blood in the  vault. There is no blood from the cervix. She does have slight erythema to the 10:00 position. Is not easily friable. Wet prep is negative. D/c home with OB follow up.   2:44 PM:  I have discussed the diagnosis/risks/treatment options with the patient and family and believe the pt to be eligible for discharge home to follow-up with GYN. We also discussed returning to the ED immediately if new or worsening sx occur. We discussed the sx which are most concerning (e.g., sudden worsening pain, fever, inability to tolerate by mouth) that necessitate immediate return. Medications administered to the patient during their visit and any new prescriptions provided to the patient are listed below.  Medications given during this visit Medications - No data to display   The patient appears reasonably screen and/or stabilized for discharge and I doubt any other medical condition or other [facility] requiring further screening, evaluation, or treatment in the ED at this time prior to discharge.    Final Clinical Impressions(s) / ED Diagnoses   Final diagnoses:  Breakthrough bleeding on Depo-Provera    New Prescriptions New Prescriptions   No medications on file   I personally performed the services described in this documentation, which was scribed in my presence. The recorded information has been reviewed and is accurate.      Carmen Plan, DO 11/24/16 1444

## 2016-11-25 LAB — GC/CHLAMYDIA PROBE AMP (~~LOC~~) NOT AT ARMC
Chlamydia: NEGATIVE
NEISSERIA GONORRHEA: NEGATIVE

## 2017-01-03 ENCOUNTER — Ambulatory Visit: Payer: Medicaid Other

## 2017-01-03 ENCOUNTER — Ambulatory Visit (INDEPENDENT_AMBULATORY_CARE_PROVIDER_SITE_OTHER): Payer: Medicaid Other | Admitting: Obstetrics

## 2017-01-03 ENCOUNTER — Encounter: Payer: Self-pay | Admitting: Obstetrics

## 2017-01-03 VITALS — BP 121/86 | HR 117 | Ht 63.0 in | Wt 143.0 lb

## 2017-01-03 DIAGNOSIS — Z3009 Encounter for other general counseling and advice on contraception: Secondary | ICD-10-CM | POA: Diagnosis not present

## 2017-01-03 DIAGNOSIS — N946 Dysmenorrhea, unspecified: Secondary | ICD-10-CM | POA: Diagnosis not present

## 2017-01-03 DIAGNOSIS — Z3042 Encounter for surveillance of injectable contraceptive: Secondary | ICD-10-CM

## 2017-01-03 MED ORDER — TRAMADOL HCL 50 MG PO TABS: 100.0000 mg | ORAL_TABLET | Freq: Four times a day (QID) | ORAL | 2 refills | Status: DC | PRN

## 2017-01-03 NOTE — Progress Notes (Signed)
Subjective:    Carmen Noble is a 31 y.o. female who presents for contraception counseling. The patient has no complaints today. The patient is not currently sexually active. Pertinent past medical history: current smoker.  The information documented in the HPI was reviewed and verified.  Menstrual History: OB History    Gravida Para Term Preterm AB Living   4 3 3   1 3    SAB TAB Ectopic Multiple Live Births     1     3      No LMP recorded.   Patient Active Problem List   Diagnosis Date Noted  . Chronic tension-type headache, intractable 05/30/2015  . Tobacco use disorder 05/30/2015  . Pregnancy with abdominal pain of left upper quadrant, antepartum   . [redacted] weeks gestation of pregnancy   . Dysmenorrhea 06/27/2013  . Other and unspecified ovarian cyst 06/27/2013   Past Medical History:  Diagnosis Date  . Depression    on zoloft  . Hypertension   . Migraine     Past Surgical History:  Procedure Laterality Date  . ANKLE SURGERY    . APPENDECTOMY    . APPENDECTOMY  2008  . HERNIA REPAIR    . HERNIA REPAIR  2010  . MOUTH SURGERY       Current Outpatient Prescriptions:  .  cyclobenzaprine (FLEXERIL) 10 MG tablet, TK 1 T PO BID, Disp: , Rfl: 1 .  DULoxetine (CYMBALTA) 60 MG capsule, TK 1 C PO D, Disp: , Rfl: 3 .  medroxyPROGESTERone (DEPO-PROVERA) 150 MG/ML injection, Inject 1 mL (150 mg total) into the muscle every 3 (three) months., Disp: 1 mL, Rfl: 3 .  nortriptyline (PAMELOR) 25 MG capsule, Take 1 capsule (25 mg total) by mouth at bedtime., Disp: 30 capsule, Rfl: 2 .  PARoxetine (PAXIL) 20 MG tablet, Take 1 tablet (20 mg total) by mouth daily., Disp: 30 tablet, Rfl: 11 .  acetaminophen (TYLENOL) 325 MG tablet, Take 650 mg by mouth every 6 (six) hours as needed for headache. Reported on 09/12/2015, Disp: , Rfl:  .  albuterol (PROVENTIL HFA;VENTOLIN HFA) 108 (90 BASE) MCG/ACT inhaler, Inhale 2 puffs into the lungs every 6 (six) hours as needed for wheezing or shortness  of breath. (Patient not taking: Reported on 01/03/2017), Disp: 1 Inhaler, Rfl: 1 .  ibuprofen (ADVIL,MOTRIN) 800 MG tablet, Take 1 tablet (800 mg total) by mouth every 8 (eight) hours as needed. (Patient not taking: Reported on 01/03/2017), Disp: 30 tablet, Rfl: 5 .  Prenat-FePoly-Metf-FA-DHA-DSS (VITAFOL FE+) 90-1-200 & 50 MG CPPK, Take 1 capsule by mouth daily before breakfast. (Patient not taking: Reported on 01/03/2017), Disp: 60 each, Rfl: 11 .  traMADol (ULTRAM) 50 MG tablet, Take 2 tablets (100 mg total) by mouth every 6 (six) hours as needed for moderate pain or severe pain., Disp: 30 tablet, Rfl: 2 Allergies  Allergen Reactions  . Bee Venom Anaphylaxis  . Penicillins Anaphylaxis    Social History  Substance Use Topics  . Smoking status: Current Every Day Smoker    Packs/day: 0.50    Years: 15.00    Types: Cigarettes  . Smokeless tobacco: Never Used     Comment: thinking about it  . Alcohol use No    Family History  Problem Relation Age of Onset  . Diabetes Father        Review of Systems Constitutional: negative for weight loss Genitourinary:negative for abnormal menstrual periods and vaginal discharge   Objective:   BP 121/86  Pulse (!) 117   Ht 5\' 3"  (1.6 m)   Wt 143 lb (64.9 kg)   BMI 25.33 kg/m    PE:  Deferred  Lab Review Urine pregnancy test Labs reviewed yes Radiologic studies reviewed no  >50% of 10 min visit spent on counseling and coordination of care.    Assessment:    31 y.o., discontinuing Depo-Provera injections, no contraindications.   No contraception desired at this time.  Considering Nexplanon next.  Plan:    All questions answered. Contraception: condoms. Discussed healthy lifestyle modifications. Follow up in 6 months.    Meds ordered this encounter  Medications  . cyclobenzaprine (FLEXERIL) 10 MG tablet    Sig: TK 1 T PO BID    Refill:  1  . DULoxetine (CYMBALTA) 60 MG capsule    Sig: TK 1 C PO D    Refill:  3  . traMADol  (ULTRAM) 50 MG tablet    Sig: Take 2 tablets (100 mg total) by mouth every 6 (six) hours as needed for moderate pain or severe pain.    Dispense:  30 tablet    Refill:  2   No orders of the defined types were placed in this encounter.

## 2017-01-03 NOTE — Progress Notes (Signed)
Pt c/o breakthrough bleeding since Depo x 2 years. Pt wants Nexplanon.

## 2017-01-19 ENCOUNTER — Other Ambulatory Visit: Payer: Self-pay | Admitting: Obstetrics

## 2017-01-19 DIAGNOSIS — N946 Dysmenorrhea, unspecified: Secondary | ICD-10-CM

## 2018-04-03 ENCOUNTER — Encounter (HOSPITAL_COMMUNITY): Payer: Self-pay

## 2018-04-03 ENCOUNTER — Emergency Department (HOSPITAL_COMMUNITY)
Admission: EM | Admit: 2018-04-03 | Discharge: 2018-04-04 | Disposition: A | Discharge status: Home / Self Care | Payer: Self-pay | Attending: Emergency Medicine | Admitting: Emergency Medicine

## 2018-04-03 DIAGNOSIS — N939 Abnormal uterine and vaginal bleeding, unspecified: Secondary | ICD-10-CM | POA: Insufficient documentation

## 2018-04-03 DIAGNOSIS — Z5321 Procedure and treatment not carried out due to patient leaving prior to being seen by health care provider: Secondary | ICD-10-CM | POA: Insufficient documentation

## 2018-04-03 NOTE — ED Triage Notes (Signed)
Pt states that she has been having vaginal bleeding for the past week and a half. No clots, not on birth control, lower abd cramping, denies n/v/d

## 2018-04-04 LAB — I-STAT BETA HCG BLOOD, ED (MC, WL, AP ONLY): I-stat hCG, quantitative: <5 m[IU]/mL (ref <5)

## 2018-04-04 NOTE — ED Notes (Signed)
Called for Pt to recheck vitals. No answer x2.  

## 2018-04-04 NOTE — ED Notes (Signed)
Pt called for treatment room, no answer.  

## 2018-04-04 NOTE — ED Notes (Signed)
Follow up call made  No answer  04/04/18  1035  s Kaylem Gidney rn

## 2018-04-11 ENCOUNTER — Ambulatory Visit: Payer: Self-pay | Admitting: Obstetrics

## 2018-04-14 ENCOUNTER — Other Ambulatory Visit: Payer: Self-pay

## 2018-04-14 ENCOUNTER — Encounter (HOSPITAL_COMMUNITY): Payer: Self-pay | Admitting: Emergency Medicine

## 2018-04-14 ENCOUNTER — Emergency Department (HOSPITAL_COMMUNITY)
Admission: EM | Admit: 2018-04-14 | Discharge: 2018-04-15 | Disposition: A | Discharge status: Home / Self Care | Payer: Self-pay | Attending: Emergency Medicine | Admitting: Emergency Medicine

## 2018-04-14 DIAGNOSIS — N939 Abnormal uterine and vaginal bleeding, unspecified: Secondary | ICD-10-CM | POA: Insufficient documentation

## 2018-04-14 DIAGNOSIS — R102 Pelvic and perineal pain: Secondary | ICD-10-CM | POA: Insufficient documentation

## 2018-04-14 DIAGNOSIS — N760 Acute vaginitis: Secondary | ICD-10-CM | POA: Insufficient documentation

## 2018-04-14 DIAGNOSIS — B9689 Other specified bacterial agents as the cause of diseases classified elsewhere: Secondary | ICD-10-CM | POA: Insufficient documentation

## 2018-04-14 LAB — URINALYSIS, ROUTINE W REFLEX MICROSCOPIC
Bilirubin Urine: NEGATIVE
Glucose, UA: NEGATIVE mg/dL
Hgb urine dipstick: NEGATIVE
Ketones, ur: NEGATIVE mg/dL
LEUKOCYTES UA: NEGATIVE
NITRITE: NEGATIVE
Protein, ur: NEGATIVE mg/dL
SPECIFIC GRAVITY, URINE: 1.031 — AB (ref 1.005–1.030)
pH: 5 (ref 5.0–8.0)

## 2018-04-14 LAB — COMPREHENSIVE METABOLIC PANEL
ALK PHOS: 58 U/L (ref 38–126)
ALT: 8 U/L (ref 0–44)
ANION GAP: 7 (ref 5–15)
AST: 12 U/L — ABNORMAL LOW (ref 15–41)
Albumin: 3.8 g/dL (ref 3.5–5.0)
BUN: 14 mg/dL (ref 6–20)
CO2: 26 mmol/L (ref 22–32)
Calcium: 8.8 mg/dL — ABNORMAL LOW (ref 8.9–10.3)
Chloride: 108 mmol/L (ref 98–111)
Creatinine, Ser: 0.64 mg/dL (ref 0.44–1.00)
GFR calc non Af Amer: >60 mL/min (ref >60)
Glucose, Bld: 89 mg/dL (ref 70–99)
Potassium: 3.9 mmol/L (ref 3.5–5.1)
SODIUM: 141 mmol/L (ref 135–145)
Total Bilirubin: 0.3 mg/dL (ref 0.3–1.2)
Total Protein: 6.6 g/dL (ref 6.5–8.1)

## 2018-04-14 LAB — LIPASE, BLOOD: LIPASE: 32 U/L (ref 11–51)

## 2018-04-14 LAB — I-STAT BETA HCG BLOOD, ED (MC, WL, AP ONLY)

## 2018-04-14 NOTE — ED Triage Notes (Signed)
Patient states that she is having bleeding July 27 and has not stopped. Patient is having back pain, dizziness, and abdominal pain.

## 2018-04-15 LAB — WET PREP, GENITAL
Sperm: NONE SEEN
TRICH WET PREP: NONE SEEN
YEAST WET PREP: NONE SEEN

## 2018-04-15 LAB — CBC
HCT: 37.4 % (ref 36.0–46.0)
HEMOGLOBIN: 12.4 g/dL (ref 12.0–15.0)
MCH: 34.3 pg — AB (ref 26.0–34.0)
MCHC: 33.2 g/dL (ref 30.0–36.0)
MCV: 103.3 fL — ABNORMAL HIGH (ref 78.0–100.0)
Platelets: 390 10*3/uL (ref 150–400)
RBC: 3.62 MIL/uL — AB (ref 3.87–5.11)
RDW: 12.5 % (ref 11.5–15.5)
WBC: 6.1 10*3/uL (ref 4.0–10.5)

## 2018-04-15 MED ORDER — METRONIDAZOLE 500 MG PO TABS
2000.0000 mg | ORAL_TABLET | Freq: Once | ORAL | Status: AC
Start: 2018-04-15 — End: 2018-04-15
  Administered 2018-04-15: 2000 mg via ORAL
  Filled 2018-04-15: qty 4

## 2018-04-15 NOTE — Discharge Instructions (Signed)
Follow-up with GYN

## 2018-04-15 NOTE — ED Provider Notes (Signed)
[facility]-EMERGENCY DEPT Provider Note   CSN: [id number] Arrival date & time: 04/14/18  2142     History   Chief Complaint Chief Complaint  Patient presents with  . Vaginal Bleeding  . Abdominal Pain  . Back Pain    HPI Verita Schneidersshley R Zidek is a 32 y.o. female.  32 yo F with a chief complaint of vaginal bleeding.  Going on for the past month or so.  The patient used to be on Depo-Provera and ran out of her Medicaid.  She is been having some bleeding.  Is not drastically changed.  She feels some lower abdominal cramping.  The history is provided by the patient.  Vaginal Bleeding  Primary symptoms include pelvic pain (cramping), vaginal bleeding.  Primary symptoms include no dysuria. There has been no fever. This is a new problem. The current episode started less than 1 hour ago. The problem occurs constantly. The problem has not changed since onset.Associated symptoms include abdominal pain. Pertinent negatives include no nausea, no vomiting and no dizziness.  Abdominal Pain   Pertinent negatives include fever, nausea, vomiting, dysuria, headaches, arthralgias and myalgias.  Back Pain   Associated symptoms include abdominal pain and pelvic pain (cramping). Pertinent negatives include no chest pain, no fever, no headaches and no dysuria.    Past Medical History:  Diagnosis Date  . Depression    on zoloft  . Hypertension   . Migraine     Patient Active Problem List   Diagnosis Date Noted  . Chronic tension-type headache, intractable 05/30/2015  . Tobacco use disorder 05/30/2015  . Pregnancy with abdominal pain of left upper quadrant, antepartum   . [redacted] weeks gestation of pregnancy   . Dysmenorrhea 06/27/2013  . Other and unspecified ovarian cyst 06/27/2013    Past Surgical History:  Procedure Laterality Date  . ANKLE SURGERY    . APPENDECTOMY    . APPENDECTOMY  2008  . HERNIA REPAIR    . HERNIA REPAIR  2010  . MOUTH SURGERY       OB History      Gravida  4   Para  3   Term  3   Preterm      AB  1   Living  3     SAB      TAB  1   Ectopic      Multiple      Live Births  3            Home Medications    Prior to Admission medications   Medication Sig Start Date End Date Taking? Authorizing Provider  acetaminophen (TYLENOL) 325 MG tablet Take 650 mg by mouth every 6 (six) hours as needed for headache. Reported on 09/12/2015    [provider]  albuterol (PROVENTIL HFA;VENTOLIN HFA) 108 (90 BASE) MCG/ACT inhaler Inhale 2 puffs into the lungs every 6 (six) hours as needed for wheezing or shortness of breath. Patient not taking: Reported on 01/03/2017 05/22/14   Marny LowensteinWenzel, Julie N, PA-C  cyclobenzaprine (FLEXERIL) 10 MG tablet TK 1 T PO BID 12/17/16   [provider]  DULoxetine (CYMBALTA) 60 MG capsule TK 1 C PO D 12/17/16   [provider]  ibuprofen (ADVIL,MOTRIN) 800 MG tablet Take 1 tablet (800 mg total) by mouth every 8 (eight) hours as needed. Patient not taking: Reported on 01/03/2017 09/12/15   Brock BadHarper, Charles A, MD  medroxyPROGESTERone (DEPO-PROVERA) 150 MG/ML injection Inject 1 mL (150 mg total) into  the muscle every 3 (three) months. 03/18/16   Brock Bad, MD  nortriptyline (PAMELOR) 25 MG capsule Take 1 capsule (25 mg total) by mouth at bedtime. 05/30/15   Everlena Cooper, Adam R, DO  PARoxetine (PAXIL) 20 MG tablet Take 1 tablet (20 mg total) by mouth daily. 05/17/16   Brock Bad, MD  Prenat-FePoly-Metf-FA-DHA-DSS (VITAFOL FE+) 90-1-200 & 50 MG CPPK Take 1 capsule by mouth daily before breakfast. Patient not taking: Reported on 01/03/2017 09/12/15   Brock Bad, MD  traMADol (ULTRAM) 50 MG tablet Take 2 tablets (100 mg total) by mouth every 6 (six) hours as needed for moderate pain or severe pain. 01/03/17   Brock Bad, MD    Family History Family History  Problem Relation Age of Onset  . Diabetes Father     Social History Social History   Tobacco Use  . Smoking  status: Current Every Day Smoker    Packs/day: 0.50    Years: 15.00    Pack years: 7.50    Types: Cigarettes  . Smokeless tobacco: Never Used  . Tobacco comment: thinking about it  Substance Use Topics  . Alcohol use: No    Alcohol/week: 0.0 standard drinks  . Drug use: No    Comment: hx of drug abuse as teenager. last use 5-6 year ago     Allergies   Bee venom and Penicillins   Review of Systems Review of Systems  Constitutional: Negative for chills and fever.  HENT: Negative for congestion and rhinorrhea.   Eyes: Negative for redness and visual disturbance.  Respiratory: Negative for shortness of breath and wheezing.   Cardiovascular: Negative for chest pain and palpitations.  Gastrointestinal: Positive for abdominal pain. Negative for nausea and vomiting.  Genitourinary: Positive for pelvic pain (cramping) and vaginal bleeding. Negative for dysuria and urgency.  Musculoskeletal: Positive for back pain. Negative for arthralgias and myalgias.  Skin: Negative for pallor and wound.  Neurological: Negative for dizziness and headaches.     Physical Exam Updated Vital Signs BP 131/74 (BP Location: Left Arm)   Pulse 72   Temp 98.8 F (37.1 C) (Oral)   Resp 18   Ht 5\' 2"  (1.575 m)   Wt 61.2 kg   LMP 03/25/2018   SpO2 97%   BMI 24.69 kg/m   Physical Exam  Constitutional: She is oriented to person, place, and time. She appears well-developed and well-nourished. No distress.  HENT:  Head: Normocephalic and atraumatic.  Eyes: Pupils are equal, round, and reactive to light. EOM are normal.  Neck: Normal range of motion. Neck supple.  Cardiovascular: Normal rate and regular rhythm. Exam reveals no gallop and no friction rub.  No murmur heard. Pulmonary/Chest: Effort normal. She has no wheezes. She has no rales.  Abdominal: Soft. She exhibits no distension and no mass. There is no tenderness. There is no guarding.  Genitourinary: Cervix exhibits no motion tenderness.  Right adnexum displays no mass and no tenderness. Left adnexum displays no mass and no tenderness.  Genitourinary Comments: Blood in the vault, trace blood at the cervix  Musculoskeletal: She exhibits no edema or tenderness.  Neurological: She is alert and oriented to person, place, and time.  Skin: Skin is warm and dry. She is not diaphoretic.  Psychiatric: She has a normal mood and affect. Her behavior is normal.  Nursing note and vitals reviewed.    ED Treatments / Results  Labs (all labs ordered are listed, but only abnormal results are displayed) Labs  Reviewed  WET PREP, GENITAL - Abnormal; Notable for the following components:      Result Value   Clue Cells Wet Prep HPF POC PRESENT (*)    WBC, Wet Prep HPF POC MODERATE (*)    All other components within normal limits  COMPREHENSIVE METABOLIC PANEL - Abnormal; Notable for the following components:   Calcium 8.8 (*)    AST 12 (*)    All other components within normal limits  CBC - Abnormal; Notable for the following components:   RBC 3.62 (*)    MCV 103.3 (*)    MCH 34.3 (*)    All other components within normal limits  URINALYSIS, ROUTINE W REFLEX MICROSCOPIC - Abnormal; Notable for the following components:   Specific Gravity, Urine 1.031 (*)    All other components within normal limits  LIPASE, BLOOD  I-STAT BETA HCG BLOOD, ED ([facility], [facility], [facility] ONLY)  GC/CHLAMYDIA PROBE AMP ([place]) NOT AT [facility]    EKG None  Radiology No results found.  Procedures Procedures (including critical care time)  Medications Ordered in ED Medications  metroNIDAZOLE (FLAGYL) tablet 2,000 mg (has no administration in time range)     Initial Impression / Assessment and Plan / ED Course  I have reviewed the triage vital signs and the nursing notes.  Pertinent labs & imaging results that were available during my care of the patient were reviewed by me and considered in my medical decision making (see chart for details).     32 yo F  with a chief complaint of vaginal bleeding.  Has been going on for about a month now.  Having some cramping associated with it.  Hemoglobin is higher than it has been previously.  Vital signs are stable.  No significant bleeding on my exam.  Positive for BV.  We will treat.  GYN follow-up.  1:58 AM:  I have discussed the diagnosis/risks/treatment options with the patient and believe the pt to be eligible for discharge home to follow-up with PCP, GYN. We also discussed returning to the ED immediately if new or worsening sx occur. We discussed the sx which are most concerning (e.g., sudden worsening pain, fever, inability to tolerate by mouth) that necessitate immediate return. Medications administered to the patient during their visit and any new prescriptions provided to the patient are listed below.  Medications given during this visit Medications  metroNIDAZOLE (FLAGYL) tablet 2,000 mg (has no administration in time range)      The patient appears reasonably screen and/or stabilized for discharge and I doubt any other medical condition or other [facility] requiring further screening, evaluation, or treatment in the ED at this time prior to discharge.    Final Clinical Impressions(s) / ED Diagnoses   Final diagnoses:  Vaginal bleeding  BV (bacterial vaginosis)    ED Discharge Orders    None       Melene PlanFloyd, Linus Weckerly, DO 04/15/18 0158

## 2018-04-17 LAB — GC/CHLAMYDIA PROBE AMP (~~LOC~~) NOT AT ARMC
CHLAMYDIA, DNA PROBE: NEGATIVE
Neisseria Gonorrhea: NEGATIVE

## 2018-05-30 ENCOUNTER — Encounter (HOSPITAL_COMMUNITY): Payer: Self-pay | Admitting: Emergency Medicine

## 2018-05-30 ENCOUNTER — Other Ambulatory Visit: Payer: Self-pay

## 2018-05-30 ENCOUNTER — Emergency Department (HOSPITAL_COMMUNITY)
Admission: EM | Admit: 2018-05-30 | Discharge: 2018-05-31 | Discharge status: Against Medical Advice | Payer: Self-pay | Attending: Emergency Medicine | Admitting: Emergency Medicine

## 2018-05-30 DIAGNOSIS — N922 Excessive menstruation at puberty: Secondary | ICD-10-CM

## 2018-05-30 DIAGNOSIS — F17210 Nicotine dependence, cigarettes, uncomplicated: Secondary | ICD-10-CM | POA: Insufficient documentation

## 2018-05-30 DIAGNOSIS — N920 Excessive and frequent menstruation with regular cycle: Secondary | ICD-10-CM | POA: Insufficient documentation

## 2018-05-30 DIAGNOSIS — I10 Essential (primary) hypertension: Secondary | ICD-10-CM | POA: Insufficient documentation

## 2018-05-30 DIAGNOSIS — N938 Other specified abnormal uterine and vaginal bleeding: Secondary | ICD-10-CM | POA: Insufficient documentation

## 2018-05-30 DIAGNOSIS — Z79899 Other long term (current) drug therapy: Secondary | ICD-10-CM | POA: Insufficient documentation

## 2018-05-30 LAB — I-STAT BETA HCG BLOOD, ED (MC, WL, AP ONLY): I-stat hCG, quantitative: <5 m[IU]/mL (ref <5)

## 2018-05-30 NOTE — ED Triage Notes (Signed)
Pt reports abdominal cramping to lower abdomen radiating to her back and heavy bleeding. Pt denies nausea and vomiting. Pt denies using birth control. Pt reports feeling lightheaded.

## 2018-05-31 LAB — CBC
HEMATOCRIT: 37.6 % (ref 36.0–46.0)
HEMOGLOBIN: 12 g/dL (ref 12.0–15.0)
MCH: 33.8 pg (ref 26.0–34.0)
MCHC: 31.9 g/dL (ref 30.0–36.0)
MCV: 105.9 fL — AB (ref 78.0–100.0)
Platelets: 399 10*3/uL (ref 150–400)
RBC: 3.55 MIL/uL — ABNORMAL LOW (ref 3.87–5.11)
RDW: 12.8 % (ref 11.5–15.5)
WBC: 6 10*3/uL (ref 4.0–10.5)

## 2018-05-31 LAB — WET PREP, GENITAL
Sperm: NONE SEEN
TRICH WET PREP: NONE SEEN
Yeast Wet Prep HPF POC: NONE SEEN

## 2018-05-31 LAB — GC/CHLAMYDIA PROBE AMP (~~LOC~~) NOT AT ARMC
Chlamydia: NEGATIVE
NEISSERIA GONORRHEA: NEGATIVE

## 2018-05-31 NOTE — ED Provider Notes (Signed)
[facility] EMERGENCY DEPARTMENT Provider Note   CSN: [id number] Arrival date & time: 05/30/18  2204     History   Chief Complaint Chief Complaint  Patient presents with  . Abdominal Cramping    HPI Carmen Noble is a 32 y.o. female.  Patient with a history of menorrhagia presents with lower abdominal cramping and heavy vaginal bleeding x 2 days. She reports similar symptoms each month for the past 4 months. She had been given Depo Provera in the past to help control symptoms but states her GYN Clearance Coots) felt she had been on it for too long and stopped. No vaginal discharge prior to bleeding. No fever, nausea, vomiting or dysuria. She states she is going through an ultra tampon every hour. No lightheadedness or positional dizziness.   The history is provided by the patient. No language interpreter was used.  Abdominal Cramping  Pertinent negatives include no chest pain, no abdominal pain and no shortness of breath.    Past Medical History:  Diagnosis Date  . Depression    on zoloft  . Hypertension   . Migraine     Patient Active Problem List   Diagnosis Date Noted  . Chronic tension-type headache, intractable 05/30/2015  . Tobacco use disorder 05/30/2015  . Pregnancy with abdominal pain of left upper quadrant, antepartum   . [redacted] weeks gestation of pregnancy   . Dysmenorrhea 06/27/2013  . Other and unspecified ovarian cyst 06/27/2013    Past Surgical History:  Procedure Laterality Date  . ANKLE SURGERY    . APPENDECTOMY    . APPENDECTOMY  2008  . HERNIA REPAIR    . HERNIA REPAIR  2010  . MOUTH SURGERY       OB History    Gravida  4   Para  3   Term  3   Preterm      AB  1   Living  3     SAB      TAB  1   Ectopic      Multiple      Live Births  3            Home Medications    Prior to Admission medications   Medication Sig Start Date End Date Taking? Authorizing Provider  acetaminophen (TYLENOL) 325 MG tablet  Take 650 mg by mouth every 6 (six) hours as needed for headache. Reported on 09/12/2015    [provider]  albuterol (PROVENTIL HFA;VENTOLIN HFA) 108 (90 BASE) MCG/ACT inhaler Inhale 2 puffs into the lungs every 6 (six) hours as needed for wheezing or shortness of breath. Patient not taking: Reported on 01/03/2017 05/22/14   Marny Lowenstein, PA-C  cyclobenzaprine (FLEXERIL) 10 MG tablet TK 1 T PO BID 12/17/16   [provider]  DULoxetine (CYMBALTA) 60 MG capsule TK 1 C PO D 12/17/16   [provider]  ibuprofen (ADVIL,MOTRIN) 800 MG tablet Take 1 tablet (800 mg total) by mouth every 8 (eight) hours as needed. Patient not taking: Reported on 01/03/2017 09/12/15   Brock Bad, MD  medroxyPROGESTERone (DEPO-PROVERA) 150 MG/ML injection Inject 1 mL (150 mg total) into the muscle every 3 (three) months. 03/18/16   Brock Bad, MD  nortriptyline (PAMELOR) 25 MG capsule Take 1 capsule (25 mg total) by mouth at bedtime. 05/30/15   Everlena Cooper, Adam R, DO  PARoxetine (PAXIL) 20 MG tablet Take 1 tablet (20 mg total) by mouth daily. 05/17/16   Clearance Coots,  Bing Neighbors, MD  Prenat-FePoly-Metf-FA-DHA-DSS (VITAFOL FE+) 90-1-200 & 50 MG CPPK Take 1 capsule by mouth daily before breakfast. Patient not taking: Reported on 01/03/2017 09/12/15   Brock Bad, MD  traMADol (ULTRAM) 50 MG tablet Take 2 tablets (100 mg total) by mouth every 6 (six) hours as needed for moderate pain or severe pain. 01/03/17   Brock Bad, MD    Family History Family History  Problem Relation Age of Onset  . Diabetes Father     Social History Social History   Tobacco Use  . Smoking status: Current Every Day Smoker    Packs/day: 0.50    Years: 15.00    Pack years: 7.50    Types: Cigarettes  . Smokeless tobacco: Never Used  . Tobacco comment: thinking about it  Substance Use Topics  . Alcohol use: No    Alcohol/week: 0.0 standard drinks  . Drug use: No    Comment: hx of drug abuse as teenager. last  use 5-6 year ago     Allergies   Bee venom and Penicillins   Review of Systems Review of Systems  Constitutional: Negative for chills and fever.  Respiratory: Negative.  Negative for shortness of breath.   Cardiovascular: Negative.  Negative for chest pain.  Gastrointestinal: Negative.  Negative for abdominal pain, nausea and vomiting.  Genitourinary: Positive for menstrual problem, pelvic pain and vaginal bleeding. Negative for vaginal discharge.  Musculoskeletal: Negative.   Skin: Negative.  Negative for pallor.  Neurological: Negative.  Negative for dizziness, syncope, weakness and light-headedness.     Physical Exam Updated Vital Signs BP 123/83 (BP Location: Right Arm)   Pulse 92   Temp 98.2 F (36.8 C) (Oral)   Resp 16   LMP 05/28/2018   SpO2 100%   Physical Exam  Constitutional: She is oriented to person, place, and time. She appears well-developed and well-nourished.  Eyes:  No conjunctival pallor.  Neck: Normal range of motion.  Cardiovascular: Normal rate.  Pulmonary/Chest: Effort normal.  Abdominal: Soft. She exhibits no distension. There is no tenderness.  Genitourinary:  Genitourinary Comments: There is bleeding from the cervix. No CMT, adnexal mass or tenderness. No vaginal discharge present. No palpable fibroid.   Neurological: She is alert and oriented to person, place, and time.  Skin: Skin is warm and dry.     ED Treatments / Results  Labs (all labs ordered are listed, but only abnormal results are displayed) Labs Reviewed  WET PREP, GENITAL  CBC  I-STAT BETA HCG BLOOD, ED ([facility], [facility], [facility] ONLY)  GC/CHLAMYDIA PROBE AMP ([place]) NOT AT [facility]    EKG None  Radiology No results found.  Procedures Procedures (including critical care time)  Medications Ordered in ED Medications - No data to display   Initial Impression / Assessment and Plan / ED Course  I have reviewed the triage vital signs and the nursing notes.  Pertinent labs &  imaging results that were available during my care of the patient were reviewed by me and considered in my medical decision making (see chart for details).     Patient with a history of menorrhagia presents with heavy vaginal bleeding every month for the past 4 months. Reports cramping that affected lower abdomen and into the lower back. She has been lost to follow up with Dr. Clearance Coots, GYN, due to loss of medicaid.   The patient appears very stable. No hypotension or tachycardia. No symptoms of lightheadedness. No pallor. Vaginal exam without evidence infection.  There is active bleeding from cervix.   The patient left the department without informing staff prior to discussion of lab results. Hgb 12.0.     Final Clinical Impressions(s) / ED Diagnoses   Final diagnoses:  None   1. Menorrhagia 2. Elopement  ED Discharge Orders    None       Elpidio Anis, PA-C 05/31/18 1610    Gilda Crease, MD 05/31/18 [phone]

## 2018-05-31 NOTE — ED Notes (Signed)
Pt left after PA's examination

## 2018-09-07 ENCOUNTER — Ambulatory Visit: Payer: Self-pay | Admitting: Obstetrics

## 2018-10-24 ENCOUNTER — Emergency Department (HOSPITAL_COMMUNITY)
Admission: EM | Admit: 2018-10-24 | Discharge: 2018-10-24 | Disposition: A | Discharge status: Home / Self Care | Payer: Self-pay | Attending: Emergency Medicine | Admitting: Emergency Medicine

## 2018-10-24 ENCOUNTER — Other Ambulatory Visit: Payer: Self-pay

## 2018-10-24 ENCOUNTER — Encounter (HOSPITAL_COMMUNITY): Payer: Self-pay | Admitting: Emergency Medicine

## 2018-10-24 DIAGNOSIS — F17210 Nicotine dependence, cigarettes, uncomplicated: Secondary | ICD-10-CM | POA: Insufficient documentation

## 2018-10-24 DIAGNOSIS — Z79899 Other long term (current) drug therapy: Secondary | ICD-10-CM | POA: Insufficient documentation

## 2018-10-24 DIAGNOSIS — M545 Low back pain, unspecified: Secondary | ICD-10-CM

## 2018-10-24 MED ORDER — METHOCARBAMOL 500 MG PO TABS: 500.0000 mg | ORAL_TABLET | Freq: Two times a day (BID) | ORAL | 0 refills | Status: DC

## 2018-10-24 NOTE — ED Triage Notes (Signed)
Onset one day ago dogs tripped patient and now has lower back pain.

## 2018-10-24 NOTE — Discharge Instructions (Addendum)
Please read attached information. If you experience any new or worsening signs or symptoms please return to the emergency room for evaluation. Please follow-up with your primary care provider or specialist as discussed. Please use medication prescribed only as directed and discontinue taking if you have any concerning signs or symptoms.   °

## 2018-10-24 NOTE — ED Notes (Signed)
Discharge instructions and prescription discussed with Pt. Pt verbalized understanding. Pt stable and ambulatory.    

## 2018-10-24 NOTE — ED Provider Notes (Signed)
[facility] EMERGENCY DEPARTMENT Provider Note   CSN: [id number] Arrival date & time: 10/24/18  1246   History   Chief Complaint Chief Complaint  Patient presents with  . Fall  . Back Pain    HPI Carmen Noble is a 33 y.o. female.     HPI   33 year old female presents status post fall.  Patient notes she was tripped up by dogs yesterday and landed on her buttock and back.  She notes generalized low back pain.  Denies any distal neurological deficits, hip pain, or any other injuries.  No medications prior to arrival.  She ports only minor pain after the incident, notes some soreness upon awakening this morning.    Past Medical History:  Diagnosis Date  . Depression    on zoloft  . Hypertension   . Migraine     Patient Active Problem List   Diagnosis Date Noted  . Chronic tension-type headache, intractable 05/30/2015  . Tobacco use disorder 05/30/2015  . Pregnancy with abdominal pain of left upper quadrant, antepartum   . [redacted] weeks gestation of pregnancy   . Dysmenorrhea 06/27/2013  . Other and unspecified ovarian cyst 06/27/2013    Past Surgical History:  Procedure Laterality Date  . ANKLE SURGERY    . APPENDECTOMY    . APPENDECTOMY  2008  . HERNIA REPAIR    . HERNIA REPAIR  2010  . MOUTH SURGERY       OB History    Gravida  4   Para  3   Term  3   Preterm      AB  1   Living  3     SAB      TAB  1   Ectopic      Multiple      Live Births  3            Home Medications    Prior to Admission medications   Medication Sig Start Date End Date Taking? Authorizing Provider  acetaminophen (TYLENOL) 325 MG tablet Take 650 mg by mouth every 6 (six) hours as needed for headache. Reported on 09/12/2015    [provider]  albuterol (PROVENTIL HFA;VENTOLIN HFA) 108 (90 BASE) MCG/ACT inhaler Inhale 2 puffs into the lungs every 6 (six) hours as needed for wheezing or shortness of breath. Patient not taking:  Reported on 01/03/2017 05/22/14   Marny Lowenstein, PA-C  cyclobenzaprine (FLEXERIL) 10 MG tablet TK 1 T PO BID 12/17/16   [provider]  DULoxetine (CYMBALTA) 60 MG capsule TK 1 C PO D 12/17/16   [provider]  ibuprofen (ADVIL,MOTRIN) 800 MG tablet Take 1 tablet (800 mg total) by mouth every 8 (eight) hours as needed. Patient not taking: Reported on 01/03/2017 09/12/15   Brock Bad, MD  medroxyPROGESTERone (DEPO-PROVERA) 150 MG/ML injection Inject 1 mL (150 mg total) into the muscle every 3 (three) months. 03/18/16   Brock Bad, MD  methocarbamol (ROBAXIN) 500 MG tablet Take 1 tablet (500 mg total) by mouth 2 (two) times daily. 10/24/18   Ollen Rao, Tinnie Gens, PA-C  nortriptyline (PAMELOR) 25 MG capsule Take 1 capsule (25 mg total) by mouth at bedtime. 05/30/15   Everlena Cooper, Adam R, DO  PARoxetine (PAXIL) 20 MG tablet Take 1 tablet (20 mg total) by mouth daily. 05/17/16   Brock Bad, MD  Prenat-FePoly-Metf-FA-DHA-DSS (VITAFOL FE+) 90-1-200 & 50 MG CPPK Take 1 capsule by mouth daily before breakfast. Patient not taking:  Reported on 01/03/2017 09/12/15   Brock Bad, MD  traMADol (ULTRAM) 50 MG tablet Take 2 tablets (100 mg total) by mouth every 6 (six) hours as needed for moderate pain or severe pain. 01/03/17   Brock Bad, MD    Family History Family History  Problem Relation Age of Onset  . Diabetes Father     Social History Social History   Tobacco Use  . Smoking status: Current Every Day Smoker    Packs/day: 0.50    Years: 15.00    Pack years: 7.50    Types: Cigarettes  . Smokeless tobacco: Never Used  . Tobacco comment: thinking about it  Substance Use Topics  . Alcohol use: No    Alcohol/week: 0.0 standard drinks  . Drug use: No    Comment: hx of drug abuse as teenager. last use 5-6 year ago     Allergies   Bee venom and Penicillins   Review of Systems Review of Systems  All other systems reviewed and are negative.    Physical  Exam Updated Vital Signs BP 112/74 (BP Location: Right Arm)   Pulse 70   Temp 98.2 F (36.8 C) (Oral)   Resp 16   Ht 5\' 3"  (1.6 m)   Wt 67.1 kg   SpO2 100%   BMI 26.22 kg/m   Physical Exam Vitals signs and nursing note reviewed.  Constitutional:      Appearance: She is well-developed.  HENT:     Head: Normocephalic and atraumatic.  Eyes:     General: No scleral icterus.       Right eye: No discharge.        Left eye: No discharge.     Conjunctiva/sclera: Conjunctivae normal.     Pupils: Pupils are equal, round, and reactive to light.  Neck:     Musculoskeletal: Normal range of motion.     Vascular: No JVD.     Trachea: No tracheal deviation.  Pulmonary:     Effort: Pulmonary effort is normal.     Breath sounds: No stridor.  Musculoskeletal:     Comments: Tenderness palpation of bilateral lower lumbar musculature and midline, nonfocal, remainder of cervical and thoracic spine nontender, bilateral sensation strength and motor function intact of the distal extremities  Neurological:     Mental Status: She is alert and oriented to person, place, and time.     Coordination: Coordination normal.  Psychiatric:        Behavior: Behavior normal.        Thought Content: Thought content normal.        Judgment: Judgment normal.      ED Treatments / Results  Labs (all labs ordered are listed, but only abnormal results are displayed) Labs Reviewed - No data to display  EKG None  Radiology No results found.  Procedures Procedures (including critical care time)  Medications Ordered in ED Medications - No data to display   Initial Impression / Assessment and Plan / ED Course  I have reviewed the triage vital signs and the nursing notes.  Pertinent labs & imaging results that were available during my care of the patient were reviewed by me and considered in my medical decision making (see chart for details).        33 year old female presents today with low back  pain.  Patient did have a fall yesterday, no significant pain at that time, soreness today.  She does have lumbar midline tenderness, this is nonsevere.  I discussed options including x-ray and symptomatic care.  Patient would like to go with symptomatic care at this time returning with any concerning signs or symptoms.  Patient discharged with symptomatic care.  She verbalized understanding and agreement to today's plan.  Final Clinical Impressions(s) / ED Diagnoses   Final diagnoses:  Acute midline low back pain without sciatica    ED Discharge Orders         Ordered    methocarbamol (ROBAXIN) 500 MG tablet  2 times daily     10/24/18 1415           Eyvonne Mechanic, PA-C 10/24/18 1416    Loren Racer, MD 10/28/18 [phone]

## 2019-10-06 DIAGNOSIS — F332 Major depressive disorder, recurrent severe without psychotic features: Secondary | ICD-10-CM | POA: Insufficient documentation

## 2019-10-06 DIAGNOSIS — F419 Anxiety disorder, unspecified: Secondary | ICD-10-CM | POA: Insufficient documentation

## 2019-11-20 ENCOUNTER — Other Ambulatory Visit: Payer: Self-pay

## 2019-11-20 ENCOUNTER — Emergency Department (HOSPITAL_COMMUNITY)
Admission: EM | Admit: 2019-11-20 | Discharge: 2019-11-20 | Disposition: A | Discharge status: Home / Self Care | Payer: Self-pay | Attending: Emergency Medicine | Admitting: Emergency Medicine

## 2019-11-20 DIAGNOSIS — Z5321 Procedure and treatment not carried out due to patient leaving prior to being seen by health care provider: Secondary | ICD-10-CM | POA: Insufficient documentation

## 2019-11-20 LAB — COMPREHENSIVE METABOLIC PANEL
ALT: 16 U/L (ref 0–44)
AST: 13 U/L — ABNORMAL LOW (ref 15–41)
Albumin: 3.5 g/dL (ref 3.5–5.0)
Alkaline Phosphatase: 60 U/L (ref 38–126)
Anion gap: 8 (ref 5–15)
BUN: 7 mg/dL (ref 6–20)
CO2: 22 mmol/L (ref 22–32)
Calcium: 8.8 mg/dL — ABNORMAL LOW (ref 8.9–10.3)
Chloride: 109 mmol/L (ref 98–111)
Creatinine, Ser: 0.73 mg/dL (ref 0.44–1.00)
GFR calc Af Amer: >60 mL/min (ref >60)
GFR calc non Af Amer: >60 mL/min (ref >60)
Glucose, Bld: 118 mg/dL — ABNORMAL HIGH (ref 70–99)
Potassium: 4.4 mmol/L (ref 3.5–5.1)
Sodium: 139 mmol/L (ref 135–145)
Total Bilirubin: 0.3 mg/dL (ref 0.3–1.2)
Total Protein: 6.5 g/dL (ref 6.5–8.1)

## 2019-11-20 LAB — CBC
HCT: 42.6 % (ref 36.0–46.0)
Hemoglobin: 13.5 g/dL (ref 12.0–15.0)
MCH: 32.5 pg (ref 26.0–34.0)
MCHC: 31.7 g/dL (ref 30.0–36.0)
MCV: 102.4 fL — ABNORMAL HIGH (ref 80.0–100.0)
Platelets: 422 10*3/uL — ABNORMAL HIGH (ref 150–400)
RBC: 4.16 MIL/uL (ref 3.87–5.11)
RDW: 12.5 % (ref 11.5–15.5)
WBC: 11.7 10*3/uL — ABNORMAL HIGH (ref 4.0–10.5)
nRBC: 0 % (ref 0.0–0.2)

## 2019-11-20 LAB — URINALYSIS, ROUTINE W REFLEX MICROSCOPIC
Bilirubin Urine: NEGATIVE
Glucose, UA: NEGATIVE mg/dL
Hgb urine dipstick: NEGATIVE
Ketones, ur: NEGATIVE mg/dL
Nitrite: NEGATIVE
Protein, ur: NEGATIVE mg/dL
Specific Gravity, Urine: 1.008 (ref 1.005–1.030)
pH: 6 (ref 5.0–8.0)

## 2019-11-20 LAB — I-STAT BETA HCG BLOOD, ED (MC, WL, AP ONLY): I-stat hCG, quantitative: <5 m[IU]/mL (ref <5)

## 2019-11-20 LAB — LIPASE, BLOOD: Lipase: 24 U/L (ref 11–51)

## 2019-11-20 MED ORDER — SODIUM CHLORIDE 0.9% FLUSH: 3.0000 mL | Freq: Once | INTRAVENOUS | Status: DC

## 2019-11-20 NOTE — ED Triage Notes (Signed)
Pt here for evaluation of lower abdominal pain since yesterday. Pain worse with movement, sts it feels like contractions. Endorses nausea when pain is intense, denies vomiting. Pt on depo shot.

## 2019-11-20 NOTE — ED Notes (Signed)
Pt name called x3 with no response. Pt seen leaving by other staff.

## 2020-06-10 ENCOUNTER — Other Ambulatory Visit (HOSPITAL_COMMUNITY)
Admission: RE | Admit: 2020-06-10 | Discharge: 2020-06-10 | Disposition: A | Discharge status: Home / Self Care | Payer: Medicaid Other | Source: Ambulatory Visit | Attending: Obstetrics and Gynecology | Admitting: Obstetrics and Gynecology

## 2020-06-10 ENCOUNTER — Other Ambulatory Visit: Payer: Self-pay

## 2020-06-10 ENCOUNTER — Ambulatory Visit: Payer: Self-pay | Admitting: *Deleted

## 2020-06-10 VITALS — BP 111/87 | Wt 149.7 lb

## 2020-06-10 DIAGNOSIS — N6452 Nipple discharge: Secondary | ICD-10-CM | POA: Insufficient documentation

## 2020-06-10 DIAGNOSIS — R87810 Cervical high risk human papillomavirus (HPV) DNA test positive: Secondary | ICD-10-CM

## 2020-06-10 DIAGNOSIS — Z1239 Encounter for other screening for malignant neoplasm of breast: Secondary | ICD-10-CM

## 2020-06-10 DIAGNOSIS — R87610 Atypical squamous cells of undetermined significance on cytologic smear of cervix (ASC-US): Secondary | ICD-10-CM

## 2020-06-10 DIAGNOSIS — N644 Mastodynia: Secondary | ICD-10-CM

## 2020-06-10 NOTE — Patient Instructions (Addendum)
Explained breast self awareness with Carmen Noble. Patient did not need a Pap smear today due to last Pap smear was 05/13/2020. Explained the colposcopy the recommended follow-up for her abnormal Pap smear. Referred patient to the [facility] for [facility] for a colposcopy to to follow-up for abnormal Pap smear 05/13/2020. Appointment scheduled Monday, June 30, 2020 at 1455. Referred patient to the [facility] for a diagnostic mammogram. Appointment scheduled Thursday, June 12, 2020 at 1450. Patient aware of appointments and will be there. Let patient know will follow-up with her within the next couple of weeks with results of her breast discharge by letter or phone. Discussed smoking cessation with patient. Referred to the [facility] Quitline and gave resources to the free smoking cessation classes at [facility]. Carmen Noble verbalized understanding.  Aniesha Haughn, Kathaleen Maser, RN 9:54 AM

## 2020-06-10 NOTE — Progress Notes (Signed)
[name] is a 34 y.o. female who presents to [facility] clinic today with complaint of bilateral white spontaneous nipple discharge.    Pap Smear: Pap smear not completed today. Last Pap smear was 05/13/2020 at [facility] Department clinic and was ASCUS with positive HPV. Per patient has a history of one other abnormal Pap smear 20 years ago that a repeat Pap smear was completed for follow-up. Last Pap smear result is available in Epic.   Physical exam: Breasts Breasts symmetrical. No skin abnormalities bilateral breasts. No nipple retraction bilateral breasts. Milky colored discharge expressed from left breast and a clear colored discharge expressed from the right breast on exam. Sample of bilateral breast discharges sent to Cytology for evaluation. No lymphadenopathy. No lumps palpated bilateral breasts. No complaints of pain or tenderness on exam.       Pelvic/Bimanual Pap is not indicated today per BCCCP guidelines.   Smoking History: Patient is a current smoker. Discussed smoking cessation with patient. Referred to the [facility] Quitline and gave resources to the free smoking cessation classes at [facility].   Patient Navigation: Patient education provided. Access to services provided for patient through BCCCP program.   Breast and Cervical Cancer Risk Assessment: Patient has family history of three maternal aunts having breast cancer. Patient has no known genetic mutations or history of radiation treatment to the chest before age 55. Patient does not have history of cervical dysplasia, immunocompromised, or DES exposure in-utero.  Risk Assessment    Risk Scores      06/10/2020   Last edited by: Meryl Dare, CMA   5-year risk: 0.2 %   Lifetime risk: 8.2 %          A: BCCCP exam without pap smear Complaint of bilateral nipple discharge. Patient referred to BCCCP by the [facility] Department due to having an abnormal Pap smear 05/13/2020 that a  colposcopy is recommended for follow-up.  P: Referred patient to the [facility] for a diagnostic mammogram. Appointment scheduled Thursday, June 12, 2020 at 1450.  Referred patient to the [facility] for [facility] for a colposcopy to to follow-up for abnormal Pap smear 05/13/2020. Appointment scheduled Monday, June 30, 2020 at 1455.  Priscille Heidelberg, RN 06/10/2020 9:54 AM

## 2020-06-11 LAB — CYTOLOGY - NON PAP

## 2020-06-12 ENCOUNTER — Other Ambulatory Visit: Payer: Self-pay

## 2020-06-12 ENCOUNTER — Ambulatory Visit: Payer: Medicaid Other

## 2020-06-12 ENCOUNTER — Other Ambulatory Visit: Payer: Self-pay | Admitting: Obstetrics and Gynecology

## 2020-06-12 ENCOUNTER — Ambulatory Visit
Admission: RE | Admit: 2020-06-12 | Discharge: 2020-06-12 | Disposition: A | Discharge status: Home / Self Care | Payer: Medicaid Other | Source: Ambulatory Visit | Attending: Obstetrics and Gynecology | Admitting: Obstetrics and Gynecology

## 2020-06-12 ENCOUNTER — Telehealth: Payer: Self-pay

## 2020-06-12 DIAGNOSIS — N6489 Other specified disorders of breast: Secondary | ICD-10-CM

## 2020-06-12 DIAGNOSIS — N6452 Nipple discharge: Secondary | ICD-10-CM

## 2020-06-12 DIAGNOSIS — N644 Mastodynia: Secondary | ICD-10-CM

## 2020-06-12 NOTE — Telephone Encounter (Signed)
Patient informed pathology results-Breast discharge,  no malignant cells found in breast discharge. Patient informed we will wait for diagnostic mammogram results, and will call if there is anything further needed. Patient verbalized understanding.

## 2020-06-17 ENCOUNTER — Other Ambulatory Visit: Payer: Self-pay | Admitting: Nurse Practitioner

## 2020-06-25 ENCOUNTER — Emergency Department (HOSPITAL_COMMUNITY)
Admission: EM | Admit: 2020-06-25 | Discharge: 2020-06-25 | Discharge status: Against Medical Advice | Payer: Medicaid Other

## 2020-06-30 ENCOUNTER — Other Ambulatory Visit: Payer: Self-pay

## 2020-06-30 ENCOUNTER — Ambulatory Visit (INDEPENDENT_AMBULATORY_CARE_PROVIDER_SITE_OTHER): Payer: Self-pay | Admitting: Obstetrics and Gynecology

## 2020-06-30 ENCOUNTER — Other Ambulatory Visit (HOSPITAL_COMMUNITY)
Admission: RE | Admit: 2020-06-30 | Discharge: 2020-06-30 | Disposition: A | Discharge status: Home / Self Care | Payer: Medicaid Other | Source: Ambulatory Visit | Attending: Obstetrics and Gynecology | Admitting: Obstetrics and Gynecology

## 2020-06-30 ENCOUNTER — Encounter: Payer: Self-pay | Admitting: Obstetrics and Gynecology

## 2020-06-30 DIAGNOSIS — Z01812 Encounter for preprocedural laboratory examination: Secondary | ICD-10-CM

## 2020-06-30 DIAGNOSIS — R87810 Cervical high risk human papillomavirus (HPV) DNA test positive: Secondary | ICD-10-CM | POA: Diagnosis present

## 2020-06-30 DIAGNOSIS — R87610 Atypical squamous cells of undetermined significance on cytologic smear of cervix (ASC-US): Secondary | ICD-10-CM

## 2020-06-30 DIAGNOSIS — D069 Carcinoma in situ of cervix, unspecified: Secondary | ICD-10-CM | POA: Insufficient documentation

## 2020-06-30 DIAGNOSIS — D060 Carcinoma in situ of endocervix: Secondary | ICD-10-CM | POA: Insufficient documentation

## 2020-06-30 HISTORY — DX: Atypical squamous cells of undetermined significance on cytologic smear of cervix (ASC-US): R87.610

## 2020-06-30 LAB — POCT PREGNANCY, URINE: Preg Test, Ur: NEGATIVE

## 2020-06-30 NOTE — Patient Instructions (Signed)
Colposcopy, Care After This sheet gives you information about how to care for yourself after your procedure. Your doctor may also give you more specific instructions. If you have problems or questions, contact your doctor. What can I expect after the procedure? If you did not have a tissue sample removed (did not have a biopsy), you may only have some spotting for a few days. You can go back to your normal activities. If you had a tissue sample removed, it is common to have:  Soreness and pain. This may last for a few days.  Light-headedness.  Mild bleeding from your vagina or dark-colored, grainy discharge from your vagina. This may last for a few days. You may need to wear a sanitary pad.  Spotting for at least 48 hours after the procedure. Follow these instructions at home:   Take over-the-counter and prescription medicines only as told by your doctor. Ask your doctor what medicines you can start taking again. This is very important if you take blood-thinning medicine.  Do not drive or use heavy machinery while taking prescription pain medicine.  For 3 days, or as long as your doctor tells you, avoid: ? Douching. ? Using tampons. ? Having sex.  If you use birth control (contraception), keep using it.  Limit activity for the first day after the procedure. Ask your doctor what activities are safe for you.  It is up to you to get the results of your procedure. Ask your doctor when your results will be ready.  Keep all follow-up visits as told by your doctor. This is important. Contact a doctor if:  You get a skin rash. Get help right away if:  You are bleeding a lot from your vagina. It is a lot of bleeding if you are using more than one pad an hour for 2 hours in a row.  You have clumps of blood (blood clots) coming from your vagina.  You have a fever.  You have chills  You have pain in your lower belly (pelvic area).  You have signs of infection, such as vaginal  discharge that is: ? Different than usual. ? Yellow. ? Bad-smelling.  You have very pain or cramps in your lower belly that do not get better with medicine.  You feel light-headed.  You feel dizzy.  You pass out (faint). Summary  If you did not have a tissue sample removed (did not have a biopsy), you may only have some spotting for a few days. You can go back to your normal activities.  If you had a tissue sample removed, it is common to have mild pain and spotting for 48 hours.  For 3 days, or as long as your doctor tells you, avoid douching, using tampons and having sex.  Get help right away if you have bleeding, very bad pain, or signs of infection. This information is not intended to replace advice given to you by your health care provider. Make sure you discuss any questions you have with your health care provider. Document Revised: 07/29/2017 Document Reviewed: 05/05/2016 Elsevier Patient Education  2020 Elsevier Inc.  

## 2020-06-30 NOTE — Progress Notes (Signed)
    GYNECOLOGY CLINIC COLPOSCOPY PROCEDURE NOTE  34 y.o. [id number] here for colposcopy for ASCUS with POSITIVE high risk HPV pap smear on 10/21. Discussed role for HPV in cervical dysplasia, need for surveillance.  Patient given informed consent, signed copy in the chart, time out was performed.  Placed in lithotomy position. Cervix viewed with speculum and colposcope after application of acetic acid.   Colposcopy adequate? Yes  no visible lesions;  biopsies obtained at 12 & 6 o'clock position.   ECC specimen obtained. Monsel's solution applied All specimens were labelled and sent to pathology.   Patient was given post procedure instructions.  Will follow up pathology and manage accordingly.  Routine preventative health maintenance measures emphasized.    Nettie Elm, MD, FACOG Attending Obstetrician & Gynecologist Center for [facility], [facility]

## 2020-07-02 LAB — SURGICAL PATHOLOGY

## 2020-07-08 ENCOUNTER — Encounter: Payer: Self-pay | Admitting: General Practice

## 2020-07-10 ENCOUNTER — Telehealth: Payer: Self-pay | Admitting: Lactation Services

## 2020-07-10 NOTE — Telephone Encounter (Signed)
Attempted to call patient to give Colpo Results and inform of need for LEEP. Patient has not read My Chart message that was sent to her.  Patient did not answer and mailbox did indicate it was above named patient. Not able to leave a message as mailbox full. Will need to call again at a later time.   Message to front office to call and schedule for LEEP.

## 2020-07-21 ENCOUNTER — Telehealth: Payer: Self-pay | Admitting: *Deleted

## 2020-07-21 NOTE — Telephone Encounter (Signed)
Called pt and discussed her Colpo results as well as LEEP appt on 12/15. She voiced understanding.

## 2020-07-21 NOTE — Telephone Encounter (Signed)
Tiffanni called today and left a message she is calling about results and also that she got a letter about an appointment in December and wants to know what that is about. Janette Harvie,RN

## 2020-07-28 ENCOUNTER — Telehealth: Payer: Self-pay

## 2020-07-28 NOTE — Telephone Encounter (Signed)
I contacted patient regarding completing application for [facility] Medicaid to cover LEEP procedure. (Colpo results-HGSIL, CIN 2-3). Patient stated she had already applied for medicaid online, and application is pending. Patient informed to contact us (BCCCP) if application is denied. Patient verbalized understanding.

## 2020-08-13 ENCOUNTER — Encounter: Payer: Self-pay | Admitting: Obstetrics and Gynecology

## 2020-08-13 ENCOUNTER — Ambulatory Visit (INDEPENDENT_AMBULATORY_CARE_PROVIDER_SITE_OTHER): Payer: Self-pay | Admitting: Obstetrics and Gynecology

## 2020-08-13 ENCOUNTER — Other Ambulatory Visit (HOSPITAL_COMMUNITY)
Admission: RE | Admit: 2020-08-13 | Discharge: 2020-08-13 | Disposition: A | Discharge status: Home / Self Care | Payer: Medicaid Other | Source: Ambulatory Visit | Attending: Obstetrics and Gynecology | Admitting: Obstetrics and Gynecology

## 2020-08-13 ENCOUNTER — Other Ambulatory Visit: Payer: Self-pay

## 2020-08-13 DIAGNOSIS — D069 Carcinoma in situ of cervix, unspecified: Secondary | ICD-10-CM

## 2020-08-13 DIAGNOSIS — R87612 Low grade squamous intraepithelial lesion on cytologic smear of cervix (LGSIL): Secondary | ICD-10-CM | POA: Insufficient documentation

## 2020-08-13 DIAGNOSIS — Z3202 Encounter for pregnancy test, result negative: Secondary | ICD-10-CM

## 2020-08-13 LAB — POCT PREGNANCY, URINE: Preg Test, Ur: NEGATIVE

## 2020-08-13 NOTE — Patient Instructions (Signed)

## 2020-08-13 NOTE — Progress Notes (Signed)
   GYNECOLOGY CLINIC PROCEDURE NOTE  Carmen Noble is a 34 y.o. [phone] here for LEEP. No GYN concerns. Pap smear and colposcopy reviewed.    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  Role of HPV,cervical dysplasia and need for close followup was empasized. Informed written consent was obtained. All questions were answered. Time out performed.  Procedure: The patient was placed in lithotomy position and the bivalved coated speculum was placed in the patient's vagina. A grounding pad placed on the patient. Lugol's solution was applied to the cervix and areas of decreased uptake were noted around the transformation zone.   Local anesthesia was administered via an intracervical block using 10cc of 2% Lidocaine with epinephrine. The suction was turned on and the Medium 1X Fisher Cone Biopsy Excisor on 50 Watts of cutting current was used to excise the area of decreased uptake and excise the entire transformation zone. Excellent hemostasis was achieved using roller ball coagulation set at 50 Watts coagulation current. Monsel's solution was then applied and the speculum was removed from the vagina. Specimens were sent to pathology.  The patient tolerated the procedure well. Post-operative instructions given to patient, including instruction to seek medical attention for persistent bright red bleeding, fever, abdominal/pelvic pain, dysuria, nausea or vomiting. She was also told about the possibility of having copious yellow to black tinged discharge for weeks. She was counseled to avoid anything in the vagina (sex/douching/tampons) for 3 weeks. She has a 4 week post-operative check to assess wound healing, review results and discuss further management.    Nettie Elm, MD, FACOG Attending Obstetrician & Gynecologist Center for [facility], [facility]

## 2020-08-15 LAB — SURGICAL PATHOLOGY

## 2020-08-18 ENCOUNTER — Encounter: Payer: Self-pay | Admitting: Obstetrics & Gynecology

## 2020-08-18 ENCOUNTER — Ambulatory Visit (INDEPENDENT_AMBULATORY_CARE_PROVIDER_SITE_OTHER): Payer: Self-pay | Admitting: Obstetrics & Gynecology

## 2020-08-18 ENCOUNTER — Telehealth: Payer: Self-pay | Admitting: *Deleted

## 2020-08-18 ENCOUNTER — Other Ambulatory Visit (HOSPITAL_COMMUNITY)
Admission: RE | Admit: 2020-08-18 | Discharge: 2020-08-18 | Disposition: A | Discharge status: Home / Self Care | Payer: Medicaid Other | Source: Ambulatory Visit | Attending: Obstetrics & Gynecology | Admitting: Obstetrics & Gynecology

## 2020-08-18 ENCOUNTER — Other Ambulatory Visit: Payer: Self-pay

## 2020-08-18 ENCOUNTER — Encounter: Payer: Self-pay | Admitting: Family Medicine

## 2020-08-18 VITALS — BP 141/70 | HR 71 | Ht 63.0 in | Wt 148.4 lb

## 2020-08-18 DIAGNOSIS — N898 Other specified noninflammatory disorders of vagina: Secondary | ICD-10-CM | POA: Diagnosis present

## 2020-08-18 DIAGNOSIS — B9689 Other specified bacterial agents as the cause of diseases classified elsewhere: Secondary | ICD-10-CM | POA: Diagnosis not present

## 2020-08-18 DIAGNOSIS — R102 Pelvic and perineal pain: Secondary | ICD-10-CM

## 2020-08-18 DIAGNOSIS — Z9889 Other specified postprocedural states: Secondary | ICD-10-CM

## 2020-08-18 DIAGNOSIS — N760 Acute vaginitis: Secondary | ICD-10-CM | POA: Insufficient documentation

## 2020-08-18 MED ORDER — METRONIDAZOLE 500 MG PO TABS
500.0000 mg | ORAL_TABLET | Freq: Two times a day (BID) | ORAL | 0 refills | Status: DC
Start: 2020-08-18 — End: 2020-09-10

## 2020-08-18 MED ORDER — GABAPENTIN 100 MG PO CAPS: 100.0000 mg | ORAL_CAPSULE | Freq: Three times a day (TID) | ORAL | 0 refills | Status: DC

## 2020-08-18 NOTE — Progress Notes (Signed)
Had LEEP on 08/13/20- states having a lot of pain, bleeding since then and bad vaginal odor.  Has elevated gad 7 - states sees PCP for that; declines [facility].  Elizabth Palka,RN

## 2020-08-18 NOTE — Progress Notes (Signed)
GYNECOLOGY  VISIT  CC:   Post procedure LEEP pain, bleeding  HPI: 34 y.o. [id number] Single White or Caucasian female here for recheck after having a LEEP with Dr. Alysia Penna on 08/13/2020.  Pt reports she had post procedure spotting but yesterday had bright red and heavy bleeding.  She had a lot of cramping with the bleeding.  She is having discharge now with odor.  Denies fever.  Took 800mg  ibuprofen this morning and didn't think this helped.  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  Does not have to do any lifting right now.  Pathology reviewed with pt.  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    GYNECOLOGIC HISTORY: No LMP recorded. Patient has had an injection. Contraception: Depo provera  Patient Active Problem List   Diagnosis Date Noted  . Severe dysplasia of cervix 08/13/2020  . Anxiety disorder 10/06/2019  . Moderately severe recurrent major depression (HCC) 10/06/2019  . Chronic tension-type headache, intractable 05/30/2015  . Tobacco use disorder 05/30/2015  . Dysmenorrhea 06/27/2013  . Other and unspecified ovarian cyst 06/27/2013    Past Medical History:  Diagnosis Date  . ASCUS with positive high risk HPV cervical 06/30/2020   10/21 pap  . Depression    on zoloft  . Hypertension   . Migraine     Past Surgical History:  Procedure Laterality Date  . ANKLE SURGERY    . APPENDECTOMY    . APPENDECTOMY  2008  . HERNIA REPAIR    . HERNIA REPAIR  2010  . MOUTH SURGERY      MEDS:   Current Outpatient Medications on File Prior to Visit  Medication Sig Dispense Refill  . acetaminophen (TYLENOL) 325 MG tablet Take 650 mg by mouth every 6 (six) hours as needed for headache. Reported on 09/12/2015    . albuterol (PROVENTIL HFA;VENTOLIN HFA) 108 (90 BASE) MCG/ACT inhaler Inhale 2 puffs into the lungs every 6 (six) hours as needed for wheezing or shortness of breath. 1 Inhaler 1  . butalbital-acetaminophen-caffeine (FIORICET) 50-325-40 MG tablet  butalbital-acetaminophen-caffeine 50 mg-325 mg-40 mg tablet  1 TABLET BY MOUTH EVERY 8-12 HOURS TAKE AS NEEDED FOR MIGRAINE H/A. 30 DAYS SUPPLY    . FLUoxetine (PROZAC) 10 MG capsule fluoxetine 10 mg capsule  TAKE 1 CAPSULE EVERY DAY BY ORAL ROUTE AS DIRECTED FOR 30 DAYS.    09/14/2015 ibuprofen (ADVIL,MOTRIN) 800 MG tablet Take 1 tablet (800 mg total) by mouth every 8 (eight) hours as needed. 30 tablet 5  . medroxyPROGESTERone (DEPO-PROVERA) 150 MG/ML injection Inject 1 mL (150 mg total) into the muscle every 3 (three) months. 1 mL 3  . medroxyPROGESTERone (DEPO-PROVERA) 150 MG/ML injection medroxyprogesterone 150 mg/mL intramuscular suspension  Inject 1 mL every 3 months by intramuscular route as directed.    . hydrOXYzine (ATARAX/VISTARIL) 25 MG tablet hydroxyzine HCl 25 mg tablet  TAKE 1 TABLET 3 TIMES A DAY BY ORAL ROUTE. (Patient not taking: Reported on 08/18/2020)    . PARoxetine (PAXIL) 20 MG tablet Take 1 tablet (20 mg total) by mouth daily. (Patient not taking: Reported on 08/18/2020) 30 tablet 11   No current facility-administered medications on file prior to visit.    ALLERGIES: Bee venom and Penicillins  Family History  Problem Relation Age of Onset  . Diabetes Father   . Brain cancer Maternal Aunt   . Lung cancer Maternal Uncle   . Breast cancer Maternal Aunt   . Breast cancer Maternal Aunt  SH:  Smoker, single  Review of Systems  Constitutional: Negative.   Genitourinary: Positive for pelvic pain, vaginal bleeding and vaginal discharge. Negative for urgency.    PHYSICAL EXAMINATION:    BP (!) 141/70   Pulse 71   Ht 5\' 3"  (1.6 m)   Wt 148 lb 6.4 oz (67.3 kg)   BMI 26.29 kg/m     General appearance: alert, cooperative and appears stated age Abdomen: soft, non-tender; bowel sounds normal; no masses,  no organomegaly Lymph:  no inguinal LAD noted  Pelvic: External genitalia:  no lesions              Urethra:  normal appearing urethra with no masses, tenderness or  lesions              Bartholins and Skenes: normal                 Vagina: normal appearing vagina with normal color, watery discharge with blackish d/w c/w Monsel's solution, some odor is present              Cervix: appearance c/w LEEP five days ago, no active bleeding              Bimanual Exam:  Uterus:  normal size, contour, position, consistency, mobility, non-tender  Chaperone was present for exam.  Assessment/Plan: 1. Vaginal odor - Cervicovaginal ancillary only( [place]) - Rx for flagyl 500mg  bid x 7 days to pharmacy.  Pt will use goodrx coupon. - Precautions given for pt to call back if symptoms do not improve  2. H/O LEEP - follow up 3-4 weeks needs to be scheduled  3. Pelvic cramping - advised motrin 800mg  every 8 hours.  Does not need rx. - gabapentin (NEURONTIN) 100 MG capsule; Take 1 capsule (100 mg total) by mouth 3 (three) times daily.  Dispense: 30 capsule; Refill: 0

## 2020-08-18 NOTE — Telephone Encounter (Signed)
Pt left message stating that she had LEEP in office last Wednesday. She is having pain and heavy bleeding.   1105  I returned pt's call and discussed her concerns. She stated that she has had pain since the procedure was completed. The pain has become worse over rhe past 3 days. She is taking advil 800 mg every 4-6 hours with no relief. Pt also states that she began bleeding yesterday. The bleeding became heavier today and she is now saturating 1 pad every 1-1.5 hrs since 0530. I advised pt that it is unsafe to take Advil 800 mg more often than every 8 hours. Per consult w/Dr. Alysia Penna, pt needs to be seen in office today. She was given appointment for 3:15 pm today. Pt voiced understanding and agreed to plan of care.

## 2020-08-20 LAB — CERVICOVAGINAL ANCILLARY ONLY
Bacterial Vaginitis (gardnerella): POSITIVE — AB
Candida Glabrata: NEGATIVE
Candida Vaginitis: NEGATIVE
Comment: NEGATIVE
Comment: NEGATIVE
Comment: NEGATIVE

## 2020-09-10 ENCOUNTER — Encounter: Payer: Self-pay | Admitting: Obstetrics and Gynecology

## 2020-09-10 ENCOUNTER — Other Ambulatory Visit: Payer: Self-pay

## 2020-09-10 ENCOUNTER — Ambulatory Visit (INDEPENDENT_AMBULATORY_CARE_PROVIDER_SITE_OTHER): Payer: Self-pay | Admitting: Obstetrics and Gynecology

## 2020-09-10 VITALS — BP 114/79 | HR 87 | Ht 62.0 in | Wt 155.7 lb

## 2020-09-10 DIAGNOSIS — D069 Carcinoma in situ of cervix, unspecified: Secondary | ICD-10-CM

## 2020-09-10 DIAGNOSIS — Z9889 Other specified postprocedural states: Secondary | ICD-10-CM | POA: Insufficient documentation

## 2020-09-10 NOTE — Progress Notes (Signed)
Carmen Noble is here for post LEEP . She reports an occ cramp at times No cycle, on Depo provera Denies any bowel or bladder dysfunction Not sexual active  Pathology reviewed with pt   PE AF  VSS Lungs clear Heart RRR Abd soft + BS  GU Nl EGBUS scant white vaginal discharge LEEP site well healed  A/P S/P LEEP  Return to nl ADL's. Repeat pap in 6 months

## 2020-12-28 HISTORY — PX: BREAST BIOPSY: SHX20

## 2021-01-02 ENCOUNTER — Other Ambulatory Visit: Payer: Self-pay | Admitting: Obstetrics and Gynecology

## 2021-01-02 ENCOUNTER — Other Ambulatory Visit: Payer: Self-pay

## 2021-01-02 ENCOUNTER — Ambulatory Visit
Admission: RE | Admit: 2021-01-02 | Discharge: 2021-01-02 | Disposition: A | Discharge status: Home / Self Care | Payer: No Typology Code available for payment source | Source: Ambulatory Visit | Attending: Obstetrics and Gynecology | Admitting: Obstetrics and Gynecology

## 2021-01-02 DIAGNOSIS — N6489 Other specified disorders of breast: Secondary | ICD-10-CM

## 2021-01-07 ENCOUNTER — Ambulatory Visit
Admission: RE | Admit: 2021-01-07 | Discharge: 2021-01-07 | Disposition: A | Discharge status: Home / Self Care | Payer: No Typology Code available for payment source | Source: Ambulatory Visit | Attending: Obstetrics and Gynecology | Admitting: Obstetrics and Gynecology

## 2021-01-07 ENCOUNTER — Other Ambulatory Visit: Payer: Self-pay | Admitting: Obstetrics and Gynecology

## 2021-01-07 ENCOUNTER — Other Ambulatory Visit: Payer: Self-pay

## 2021-01-07 DIAGNOSIS — N6489 Other specified disorders of breast: Secondary | ICD-10-CM

## 2021-02-04 ENCOUNTER — Other Ambulatory Visit: Payer: Self-pay

## 2021-02-04 ENCOUNTER — Emergency Department (HOSPITAL_COMMUNITY)
Admission: EM | Admit: 2021-02-04 | Discharge: 2021-02-04 | Disposition: A | Discharge status: Home / Self Care | Payer: No Typology Code available for payment source | Attending: Emergency Medicine | Admitting: Emergency Medicine

## 2021-02-04 ENCOUNTER — Encounter (HOSPITAL_COMMUNITY): Payer: Self-pay

## 2021-02-04 DIAGNOSIS — F17210 Nicotine dependence, cigarettes, uncomplicated: Secondary | ICD-10-CM | POA: Insufficient documentation

## 2021-02-04 DIAGNOSIS — M546 Pain in thoracic spine: Secondary | ICD-10-CM | POA: Insufficient documentation

## 2021-02-04 DIAGNOSIS — I10 Essential (primary) hypertension: Secondary | ICD-10-CM | POA: Insufficient documentation

## 2021-02-04 LAB — URINALYSIS, ROUTINE W REFLEX MICROSCOPIC
Bilirubin Urine: NEGATIVE
Glucose, UA: NEGATIVE mg/dL
Hgb urine dipstick: NEGATIVE
Ketones, ur: NEGATIVE mg/dL
Leukocytes,Ua: NEGATIVE
Nitrite: NEGATIVE
Protein, ur: NEGATIVE mg/dL
Specific Gravity, Urine: 1.024 (ref 1.005–1.030)
pH: 5 (ref 5.0–8.0)

## 2021-02-04 LAB — PREGNANCY, URINE: Preg Test, Ur: NEGATIVE

## 2021-02-04 MED ORDER — METHOCARBAMOL 500 MG PO TABS
1000.0000 mg | ORAL_TABLET | Freq: Once | ORAL | Status: AC
  Administered 2021-02-04: 1000 mg via ORAL
  Filled 2021-02-04 (×2): qty 2

## 2021-02-04 MED ORDER — IBUPROFEN 400 MG PO TABS
600.0000 mg | ORAL_TABLET | Freq: Once | ORAL | Status: AC
  Administered 2021-02-04: 600 mg via ORAL
  Filled 2021-02-04: qty 1

## 2021-02-04 MED ORDER — METHOCARBAMOL 500 MG PO TABS: 1000.0000 mg | ORAL_TABLET | Freq: Three times a day (TID) | ORAL | 0 refills | Status: AC | PRN

## 2021-02-04 NOTE — ED Provider Notes (Signed)
Emergency Medicine Provider Triage Evaluation Note  Carmen Noble , a 35 y.o. female  was evaluated in triage.  Pt complains of mid back pain that started when she woke up this morning. Denies any injury or recent heavy lifting. Denies numbness/weakness to the legs. Denies h/o ivdu, fevers or incontinence.  Review of Systems  Positive: Back pain Negative: Numbness/weakness, fevers, incontinence  Physical Exam  There were no vitals taken for this visit. Gen:   Awake, no distress   Resp:  Normal effort  MSK:   Moves extremities without difficulty  Other:  ttp to the lower thoracic and lumbar spine, 5/5 strength to the ble, ambulatory with steady gait  Medical Decision Making  Medically screening exam initiated at 7:39 PM.  Appropriate orders placed.  Carmen Noble was informed that the remainder of the evaluation will be completed by another provider, this initial triage assessment does not replace that evaluation, and the importance of remaining in the ED until their evaluation is complete.     Karrie Meres, PA-C 02/04/21 1940    Pricilla Loveless, MD 02/04/21 [phone]

## 2021-02-04 NOTE — ED Triage Notes (Signed)
Pt states that she woke up today with back pain in the lumber region some dysuria, denies injury

## 2021-02-04 NOTE — ED Provider Notes (Signed)
[facility] EMERGENCY DEPARTMENT Provider Note   CSN: [id number] Arrival date & time: 02/04/21  1934     History Chief Complaint  Patient presents with  . Back Pain    Carmen Noble is a 35 y.o. female.  HPI 35 year old female presents with acute thoracic/lumbar back pain.  Started this morning when she woke up.  Does not remember any type of injury.  It is a burning sensation.  No direct trauma.  No weakness or numbness in extremities, incontinence, or fever.  No IV drug abuse.  Took some Tylenol and later took some ibuprofen.  No relief.  Past Medical History:  Diagnosis Date  . ASCUS with positive high risk HPV cervical 06/30/2020   10/21 pap  . Depression    on zoloft  . Hypertension   . Migraine     Patient Active Problem List   Diagnosis Date Noted  . History of loop electrical excision procedure (LEEP) 09/10/2020  . Severe dysplasia of cervix 08/13/2020  . Anxiety disorder 10/06/2019  . Moderately severe recurrent major depression (HCC) 10/06/2019  . Chronic tension-type headache, intractable 05/30/2015  . Tobacco use disorder 05/30/2015  . Dysmenorrhea 06/27/2013  . Other and unspecified ovarian cyst 06/27/2013    Past Surgical History:  Procedure Laterality Date  . ANKLE SURGERY    . APPENDECTOMY    . APPENDECTOMY  2008  . HERNIA REPAIR    . HERNIA REPAIR  2010  . MOUTH SURGERY       OB History    Gravida  4   Para  3   Term  3   Preterm      AB  1   Living  3     SAB      IAB  1   Ectopic      Multiple      Live Births  3           Family History  Problem Relation Age of Onset  . Diabetes Father   . Brain cancer Maternal Aunt   . Lung cancer Maternal Uncle   . Breast cancer Maternal Aunt   . Breast cancer Maternal Aunt     Social History   Tobacco Use  . Smoking status: Current Every Day Smoker    Packs/day: 0.50    Years: 15.00    Pack years: 7.50    Types: Cigarettes  . Smokeless tobacco:  Never Used  . Tobacco comment: thinking about it  Vaping Use  . Vaping Use: Some days  Substance Use Topics  . Alcohol use: No    Alcohol/week: 0.0 standard drinks  . Drug use: No    Comment: hx of drug abuse as teenager. last use 5-6 year ago    Home Medications Prior to Admission medications   Medication Sig Start Date End Date Taking? Authorizing Provider  methocarbamol (ROBAXIN) 500 MG tablet Take 2 tablets (1,000 mg total) by mouth every 8 (eight) hours as needed for muscle spasms. 02/04/21  Yes Pricilla Loveless, MD  acetaminophen (TYLENOL) 325 MG tablet Take 650 mg by mouth every 6 (six) hours as needed for headache. Reported on 09/12/2015    [provider]  albuterol (PROVENTIL HFA;VENTOLIN HFA) 108 (90 BASE) MCG/ACT inhaler Inhale 2 puffs into the lungs every 6 (six) hours as needed for wheezing or shortness of breath. 05/22/14   Marny Lowenstein, PA-C  butalbital-acetaminophen-caffeine (FIORICET) [phone] MG tablet butalbital-acetaminophen-caffeine 50 mg-325 mg-40 mg tablet  1  TABLET BY MOUTH EVERY 8-12 HOURS TAKE AS NEEDED FOR MIGRAINE H/A. 30 DAYS SUPPLY    [provider]  FLUoxetine (PROZAC) 10 MG capsule fluoxetine 10 mg capsule  TAKE 1 CAPSULE EVERY DAY BY ORAL ROUTE AS DIRECTED FOR 30 DAYS.    [provider]  ibuprofen (ADVIL,MOTRIN) 800 MG tablet Take 1 tablet (800 mg total) by mouth every 8 (eight) hours as needed. 09/12/15   Brock Bad, MD  medroxyPROGESTERone (DEPO-PROVERA) 150 MG/ML injection Inject 1 mL (150 mg total) into the muscle every 3 (three) months. 03/18/16   Brock Bad, MD  medroxyPROGESTERone (DEPO-PROVERA) 150 MG/ML injection medroxyprogesterone 150 mg/mL intramuscular suspension  Inject 1 mL every 3 months by intramuscular route as directed.    [provider]    Allergies    Bee venom and Penicillins  Review of Systems   Review of Systems  Constitutional: Negative for fever.  Gastrointestinal: Negative  for abdominal pain.  Genitourinary: Negative for dysuria and hematuria.  Musculoskeletal: Positive for back pain.  Neurological: Negative for weakness and numbness.  All other systems reviewed and are negative.   Physical Exam Updated Vital Signs BP 119/88   Pulse 90   Temp 98.8 F (37.1 C) (Oral)   Resp 14   SpO2 99%   Physical Exam Vitals and nursing note reviewed.  Constitutional:      General: She is not in acute distress.    Appearance: She is well-developed. She is not ill-appearing or diaphoretic.  HENT:     Head: Normocephalic and atraumatic.     Right Ear: External ear normal.     Left Ear: External ear normal.     Nose: Nose normal.  Eyes:     General:        Right eye: No discharge.        Left eye: No discharge.  Cardiovascular:     Rate and Rhythm: Normal rate and regular rhythm.     Heart sounds: Normal heart sounds.  Pulmonary:     Effort: Pulmonary effort is normal.     Breath sounds: Normal breath sounds.  Abdominal:     Palpations: Abdomen is soft.     Tenderness: There is no abdominal tenderness.  Musculoskeletal:     Thoracic back: Tenderness present.     Lumbar back: Tenderness present.     Comments: Inferior midline thoracic tenderness (lower) and upper lumbar tenderness. No stepoffs/deformity  Skin:    General: Skin is warm and dry.  Neurological:     Mental Status: She is alert.     Comments: 5/5 strength in BLE. Grossly normal sensation  Psychiatric:        Mood and Affect: Mood is not anxious.     ED Results / Procedures / Treatments   Labs (all labs ordered are listed, but only abnormal results are displayed) Labs Reviewed  URINALYSIS, ROUTINE W REFLEX MICROSCOPIC - Abnormal; Notable for the following components:      Result Value   APPearance HAZY (*)    All other components within normal limits  PREGNANCY, URINE    EKG None  Radiology No results found.  Procedures Procedures   Medications Ordered in ED Medications   ibuprofen (ADVIL) tablet 600 mg (has no administration in time range)  methocarbamol (ROBAXIN) tablet 1,000 mg (has no administration in time range)    ED Course  I have reviewed the triage vital signs and the nursing notes.  Pertinent labs & imaging results  that were available during my care of the patient were reviewed by me and considered in my medical decision making (see chart for details).    MDM Rules/Calculators/A&P                          I do not think emergent imaging is needed.  With a benign exam I think acute spinal cord emergency is quite low likelihood.  No trauma to suggest fracture.  I do not think x-rays would be helpful.  Will treat with muscle relaxer in addition to the Tylenol and ibuprofen and suggest heat/ice.  We discussed return precautions but she appears stable for discharge home. Final Clinical Impression(s) / ED Diagnoses Final diagnoses:  Acute midline thoracic back pain    Rx / DC Orders ED Discharge Orders         Ordered    methocarbamol (ROBAXIN) 500 MG tablet  Every 8 hours PRN        02/04/21 2211           Pricilla Loveless, MD 02/04/21 2213

## 2021-02-04 NOTE — Discharge Instructions (Signed)
If you develop worsening, recurrent, or continued back pain, numbness or weakness in the legs, incontinence of your bowels or bladders, numbness of your buttocks, fever, abdominal pain, or any other new/concerning symptoms then return to the ER for evaluation.  

## 2021-02-19 ENCOUNTER — Other Ambulatory Visit: Payer: Self-pay

## 2021-02-19 ENCOUNTER — Ambulatory Visit (HOSPITAL_COMMUNITY)
Admission: EM | Admit: 2021-02-19 | Discharge: 2021-02-19 | Disposition: A | Discharge status: Home / Self Care | Payer: No Payment, Other | Attending: Behavioral Health | Admitting: Behavioral Health

## 2021-02-19 DIAGNOSIS — F332 Major depressive disorder, recurrent severe without psychotic features: Secondary | ICD-10-CM | POA: Insufficient documentation

## 2021-02-19 DIAGNOSIS — F419 Anxiety disorder, unspecified: Secondary | ICD-10-CM | POA: Insufficient documentation

## 2021-02-19 DIAGNOSIS — Z653 Problems related to other legal circumstances: Secondary | ICD-10-CM | POA: Insufficient documentation

## 2021-02-19 MED ORDER — HYDROXYZINE PAMOATE 25 MG PO CAPS: 25.0000 mg | ORAL_CAPSULE | Freq: Three times a day (TID) | ORAL | 0 refills | Status: AC | PRN

## 2021-02-19 NOTE — ED Provider Notes (Signed)
Behavioral Health Urgent Care Medical Screening Exam  Patient Name: Carmen Noble MRN: [id number] Date of Evaluation: 02/19/21 Chief Complaint:   Diagnosis:  Final diagnoses:  Moderately severe recurrent major depression (HCC)    History of Present illness: Carmen Noble is a 35 y.o. female Patient presents to the [facility] voluntarily as a walk-in unaccompanied with a chief complaint of "worsening depression and anxiety for past 2 months." Patient's psychiatric hx includes MDD.  Patient reports that she went to the health department for a pap smear and was feeling down and depressed so they recommended she come here to be evaluated. On approach she hands me a note from the health department nurse who recommends Buspar and Vistaril. Patient states that she going through a custody battle with her ex boyfriend outside of court and he barely lets her see her kids. She states that her ex is a very mean person. She did not specify why she's going through a custody battle with her ex. She states that she is currently living with her mother and woks full-time at a [employer]. She endorses depressive symptoms of: sadness hopelessness, worthlessness, lack of interest or pleasure in activities, poor sleep, sleeping all the time, poor appetite, loneliness , isolating, decreased energy and crying spells. She endorses anxiety symptoms of: worrying a lot, decreased concentration and feeling nervous. She states that she used to take Prozac 10 mg po daily for depression but stopped taking it awhile ago because her medicaid stopped. She states that the Prozac was prescribed by her PCP Dr. Dareen Piano. She denies a hx of psychiatric inpatient hospitalizations or outpatient services. She does not report using drugs or alcohol.  On approach, the patient is alert and oriented x 4. She answers questions appropriately. Mood is depressed and affect is tearful. Her thought process  is logical, speech coherent and tone is normal. She is causally dressed. She denies suicidal thoughts. She denies self harm behaviors. She denies a prior hx of suicide attempts. She denies homicidal ideations. She denies AVH. She does not appear to be responding to internal, or external stimuli.   We discussed taking hydroxyzine 25 mg po PRN TID if needed for anxiety or sleep. We discussed the side effects of hydroxyzine and she was provided with a handout on hydroxyzine. Patient provided with a printed prescription for hydroxyzine 25 mg po PRN TID for anxiety/sleep #21. Patient was scheduled an outpatient appointment with psychiatry on 04/15/21 for medication management and therapy. Patient verbalizes understanding and agrees to the stated plan.   Psychiatric Specialty Exam  Presentation  General Appearance:Appropriate for Environment  Eye Contact:Fair  Speech:Clear and Coherent  Speech Volume:Normal  Handedness:Right   Mood and Affect  Mood:Depressed  Affect:Congruent   Thought Process  Thought Processes:Coherent  Descriptions of Associations:Intact  Orientation:Full (Time, Place and Person)  Thought Content:WDL    Hallucinations:None  Ideas of Reference:None  Suicidal Thoughts:No  Homicidal Thoughts:No   Sensorium  Memory:Immediate Fair; Remote Fair; Recent Fair  Judgment:Fair  Insight:Fair   Executive Functions  Concentration:Fair  Attention Span:Fair  Recall: No data recorded Fund of Knowledge:Fair  Language:Fair   Psychomotor Activity  Psychomotor Activity:Normal   Assets  Assets:Communication Skills; Desire for Improvement; Housing; Leisure Time; Physical Health   Sleep  Sleep:Poor  Number of hours: 4   No data recorded  Physical Exam: Physical Exam HENT:     Head: Normocephalic.     Nose: Nose normal.  Eyes:  Conjunctiva/sclera: Conjunctivae normal.  Cardiovascular:     Rate and Rhythm: Normal rate.  Pulmonary:      Effort: Pulmonary effort is normal.  Musculoskeletal:        General: Normal range of motion.     Cervical back: Normal range of motion.  Neurological:     General: No focal deficit present.     Mental Status: She is alert.  Psychiatric:        Attention and Perception: Attention normal.        Mood and Affect: Mood is depressed.        Speech: Speech normal.        Behavior: Behavior normal. Behavior is cooperative.        Thought Content: Thought content normal.        Cognition and Memory: Cognition normal.        Judgment: Judgment normal.   Review of Systems  Constitutional: Negative.   HENT: Negative.    Eyes: Negative.   Respiratory: Negative.    Cardiovascular: Negative.   Gastrointestinal: Negative.   Genitourinary: Negative.   Musculoskeletal: Negative.   Skin: Negative.   Neurological: Negative.   Endo/Heme/Allergies: Negative.   Psychiatric/Behavioral:  Positive for depression. Negative for hallucinations, substance abuse and suicidal ideas. The patient is nervous/anxious and has insomnia.   Blood pressure 129/81, pulse 78, temperature 98.7 F (37.1 C), temperature source Oral, resp. rate 16, SpO2 100 %. There is no height or weight on file to calculate BMI.  Musculoskeletal: Strength & Muscle Tone: within normal limits Gait & Station: normal Patient leans: N/A   [facility] MSE Discharge Disposition for Follow up and Recommendations: Based on my evaluation the patient does not appear to have an emergency medical condition and can be discharged with resources and follow up care in outpatient services for Medication Management, Individual Therapy, and Group Therapy  Patient does not meet criteria for inpatient treatment and will benefit from outpatient resources.    Welborn Keena L, NP 02/19/2021, 2:01 PM

## 2021-02-19 NOTE — Discharge Instructions (Addendum)

## 2021-02-19 NOTE — BH Assessment (Signed)
Pt to [facility] due to worsening depression for past 2 months. Reports stressors related to not being able to see her children because of her ex-boyfriend. Pt denies SI, HI, AVH.  Pt is routine.

## 2021-02-19 NOTE — Discharge Summary (Signed)
Verita Schneiders to be D/C'd Home per NP order. Discussed with the patient and all questions fully answered. An After Visit Summary was printed and given to the patient.  Patient escorted out and D/C home via private auto.  Dickie La  02/19/2021 2:06 PM

## 2021-04-15 ENCOUNTER — Ambulatory Visit (HOSPITAL_COMMUNITY): Payer: No Payment, Other | Admitting: Psychiatry

## 2021-04-15 ENCOUNTER — Ambulatory Visit (HOSPITAL_COMMUNITY): Payer: No Payment, Other | Admitting: Licensed Clinical Social Worker

## 2021-04-20 ENCOUNTER — Ambulatory Visit (INDEPENDENT_AMBULATORY_CARE_PROVIDER_SITE_OTHER): Payer: Self-pay | Admitting: Obstetrics and Gynecology

## 2021-04-20 ENCOUNTER — Other Ambulatory Visit: Payer: Self-pay

## 2021-04-20 ENCOUNTER — Encounter: Payer: Self-pay | Admitting: Obstetrics and Gynecology

## 2021-04-20 ENCOUNTER — Other Ambulatory Visit (HOSPITAL_COMMUNITY)
Admission: RE | Admit: 2021-04-20 | Discharge: 2021-04-20 | Disposition: A | Discharge status: Home / Self Care | Payer: Medicaid Other | Source: Ambulatory Visit | Attending: Obstetrics and Gynecology | Admitting: Obstetrics and Gynecology

## 2021-04-20 VITALS — BP 136/91 | HR 75 | Ht 63.0 in | Wt 157.7 lb

## 2021-04-20 DIAGNOSIS — Z3202 Encounter for pregnancy test, result negative: Secondary | ICD-10-CM

## 2021-04-20 DIAGNOSIS — N870 Mild cervical dysplasia: Secondary | ICD-10-CM | POA: Insufficient documentation

## 2021-04-20 DIAGNOSIS — Z9889 Other specified postprocedural states: Secondary | ICD-10-CM

## 2021-04-20 LAB — POCT PREGNANCY, URINE: Preg Test, Ur: NEGATIVE

## 2021-04-20 NOTE — Progress Notes (Signed)
Patient given informed consent, signed copy in the chart, time out was performed.  Placed in lithotomy position.  Chart and path reviewed.  Pt has had ASCUS with HPV and LGSIL the last 2-3 years.  Pt s/p LEEP with LGSIL.  UPT neg Cervix viewed with speculum and colposcope after application of acetic acid and Lugol's solution.  Colposcopy adequate?  no Acetowhite lesions? One questionable lesion at 5 o'clock nonstaining with lugol's, HPV effect versus CIN 1 Punctation?no Mosaicism?  no Abnormal vasculature?  no Biopsies? One at 5 o'clock ECC? attempted ECC with curettage and cytobrush, cervix is very stenotic  COMMENTS: Patient was given post procedure instructions.  Follow up dependent on biopsy results.  Warden Fillers, MD

## 2021-04-22 LAB — SURGICAL PATHOLOGY

## 2021-04-29 ENCOUNTER — Ambulatory Visit: Payer: No Typology Code available for payment source

## 2021-05-30 ENCOUNTER — Encounter: Payer: Self-pay | Admitting: Radiology

## 2021-06-09 ENCOUNTER — Telehealth: Payer: Self-pay

## 2021-06-09 NOTE — Telephone Encounter (Signed)
Patient left message requesting return call. Call returned,  Left message on identifying voicemail requesting a return call.

## 2022-01-05 ENCOUNTER — Emergency Department (HOSPITAL_COMMUNITY)
Admission: EM | Admit: 2022-01-05 | Discharge: 2022-01-05 | Disposition: A | Discharge status: Home / Self Care | Payer: Medicaid Other | Attending: Emergency Medicine | Admitting: Emergency Medicine

## 2022-01-05 ENCOUNTER — Other Ambulatory Visit: Payer: Self-pay

## 2022-01-05 ENCOUNTER — Encounter (HOSPITAL_COMMUNITY): Payer: Self-pay | Admitting: *Deleted

## 2022-01-05 DIAGNOSIS — M79671 Pain in right foot: Secondary | ICD-10-CM | POA: Insufficient documentation

## 2022-01-05 DIAGNOSIS — I10 Essential (primary) hypertension: Secondary | ICD-10-CM | POA: Insufficient documentation

## 2022-01-05 DIAGNOSIS — M7989 Other specified soft tissue disorders: Secondary | ICD-10-CM | POA: Insufficient documentation

## 2022-01-05 DIAGNOSIS — M79604 Pain in right leg: Secondary | ICD-10-CM

## 2022-01-05 MED ORDER — MELOXICAM 15 MG PO TABS: 15.0000 mg | ORAL_TABLET | Freq: Every day | ORAL | 0 refills | Status: AC

## 2022-01-05 NOTE — ED Triage Notes (Signed)
Pt with right foot pain, dx with plantar fasciitis.  Pt was at work and on her feet a lot, using her left leg more to keep weight off the right foot, now has pain to left leg and knee. Motrin at 1700 today ?

## 2022-01-05 NOTE — Discharge Instructions (Addendum)
IMPORTANT PATIENT INSTRUCTIONS:  Your ED provider has recommended an Outpatient Ultrasound.  Please call [phone] to schedule an appointment.  If your appointment is scheduled for a Saturday, Sunday or holiday, please go to the [facility] Registration Desk at least 15 minutes prior to your appointment time and tell them you are there for an ultrasound.    If your appointment is scheduled for a weekday (Monday-Friday), please go directly to the [facility] at least 15 minutes prior to your appointment time and tell them you are there for an ultrasound.  Please call [phone] with questions. 

## 2022-01-05 NOTE — ED Provider Notes (Signed)
? ?  Emergency Department Provider Note ? ? ?I have reviewed the triage vital signs and the nursing notes. ? ? ?HISTORY ? ?Chief Complaint ?Foot Pain ? ? ?HPI ?Carmen Noble is a 36 y.o. female past history of planter fasciitis presents emergency department for evaluation of pain to the bottom of the right heel and now radiating into the right calf.  She is noted some ankle swelling.  No injury.  Symptoms of been building over the past several days.  She recently had sciatica in that same leg which has resolved.  No chest pain or shortness of breath.  No skin changes.  ? ?Past Medical History:  ?Diagnosis Date  ? 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 10/21 pap  ? Depression   ? on zoloft  ? Hypertension   ? Migraine   ? ? ?Review of Systems ? ?Constitutional: No fever/chills ?Cardiovascular: Denies chest pain. ?Respiratory: Denies shortness of breath. ?Gastrointestinal: No abdominal pain.   ?Genitourinary: Negative for dysuria. ?Musculoskeletal: Positive right heel pain and calf pain.  ? ?____________________________________________ ? ? ?PHYSICAL EXAM: ? ?VITAL SIGNS: ?ED Triage Vitals  ?Enc Vitals Group  ?   BP 01/05/22 2148 114/79  ?   Pulse Rate 01/05/22 2148 92  ?   Resp 01/05/22 2148 18  ?   Temp 01/05/22 2148 98 ?F (36.7 ?C)  ?   Temp Source 01/05/22 2148 Oral  ?   SpO2 01/05/22 2148 98 %  ?   Weight 01/05/22 2144 170 lb (77.1 kg)  ?   Height 01/05/22 2144 5\' 2"  (1.575 m)  ? ?Constitutional: Alert and oriented. Well appearing and in no acute distress. ?Eyes: Conjunctivae are normal.  ?Head: Atraumatic. ?Nose: No congestion/rhinnorhea. ?Mouth/Throat: Mucous membranes are moist.   ?Neck: No stridor.   ?Cardiovascular: Good peripheral circulation.   ?Respiratory: Normal respiratory effort.  ?Gastrointestinal: No distention.  ?Musculoskeletal: No gross deformities of extremities.  Mild swelling of the right ankle and tenderness of the heel.  No bony abnormality.  No proximal fibular tenderness.   ?Neurologic:  Normal speech and language.  ?Skin:  Skin is warm, dry and intact. No rash noted. ? ? ?____________________________________________ ? ? ?PROCEDURES ? ?Procedure(s) performed:  ? ?Procedures ? ?None ?____________________________________________ ? ? ?INITIAL IMPRESSION / ASSESSMENT AND PLAN / ED COURSE ? ?Pertinent labs & imaging results that were available during my care of the patient were reviewed by me and considered in my medical decision making (see chart for details). ?  ? ?Medical Decision Making: Summary:  ?Patient presents emergency department for evaluation of right leg and heel pain with some ankle swelling.  Ultrasound not available at this hour.  Plan to bring the patient back for DVT ultrasound.  Have started her on meloxicam for short course and provided crutches to use for comfort.  Advise close PCP follow-up.  If symptoms continue will need referral to sports medicine.  ? ?Disposition: discharge ? ?____________________________________________ ? ?FINAL CLINICAL IMPRESSION(S) / ED DIAGNOSES ? ?Final diagnoses:  ?Foot pain, right  ? ? ? ?NEW OUTPATIENT MEDICATIONS STARTED DURING THIS VISIT: ? ?New Prescriptions  ? MELOXICAM (MOBIC) 15 MG TABLET    Take 1 tablet (15 mg total) by mouth daily for 20 days.  ? ? ?Note:  This document was prepared using Dragon voice recognition software and may include unintentional dictation errors. ? ? , MD, F[facility] ?Emergency Medicine ? ?  ?Alona Bene, MD ?01/05/22 2300 ? ?

## 2022-01-06 ENCOUNTER — Ambulatory Visit (HOSPITAL_COMMUNITY)
Admission: RE | Admit: 2022-01-06 | Discharge: 2022-01-06 | Disposition: A | Discharge status: Home / Self Care | Payer: Self-pay | Source: Ambulatory Visit | Attending: Emergency Medicine | Admitting: Emergency Medicine

## 2022-01-06 DIAGNOSIS — M79604 Pain in right leg: Secondary | ICD-10-CM | POA: Insufficient documentation

## 2022-03-22 ENCOUNTER — Other Ambulatory Visit: Payer: Self-pay

## 2022-03-22 DIAGNOSIS — N6489 Other specified disorders of breast: Secondary | ICD-10-CM

## 2022-05-06 ENCOUNTER — Ambulatory Visit
Admission: RE | Admit: 2022-05-06 | Discharge: 2022-05-06 | Disposition: A | Discharge status: Home / Self Care | Payer: No Typology Code available for payment source | Source: Ambulatory Visit | Attending: Obstetrics and Gynecology | Admitting: Obstetrics and Gynecology

## 2022-05-06 ENCOUNTER — Ambulatory Visit: Payer: Self-pay | Admitting: *Deleted

## 2022-05-06 ENCOUNTER — Ambulatory Visit: Payer: Medicaid Other

## 2022-05-06 VITALS — BP 114/57 | Wt 174.3 lb

## 2022-05-06 DIAGNOSIS — Z01419 Encounter for gynecological examination (general) (routine) without abnormal findings: Secondary | ICD-10-CM

## 2022-05-06 DIAGNOSIS — N6489 Other specified disorders of breast: Secondary | ICD-10-CM

## 2022-05-06 NOTE — Progress Notes (Signed)
[name] is a 36 y.o. [phone] female who presents to [facility] clinic today with complaint of bilateral white spontaneous nipple discharge since prior to previous exam 06/10/2020 that a sample was sent to Cytology that was benign.     Pap Smear: Pap smear completed today. Last Pap smear was 02/19/2021 at the [facility] and [facility] with positive HPV that a colposcopy was completed for follow up 04/20/2021 that was benign. 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 Per patient has a history of one other abnormal Pap smear 22 years ago that a repeat Pap smear was completed for follow-up. Last two Pap smear results are available in Epic.  Physical exam: Breasts Breasts symmetrical. No skin abnormalities bilateral breasts. No nipple retraction bilateral breasts. No nipple discharge bilateral breasts. Unable to express any nipple discharge on exam today. No lymphadenopathy. No lumps palpated bilateral breasts. No complaints of pain or tenderness on exam.      Pelvic/Bimanual Ext Genitalia No lesions, no swelling and no discharge observed on external genitalia.        Vagina Vagina pink and normal texture. No lesions or discharge observed in vagina.        Cervix Cervix is present. Cervix pink and of normal texture. No discharge observed.    Uterus Uterus is present and palpable. Uterus in normal position and normal size.        Adnexae Bilateral ovaries present and palpable. No tenderness on palpation.         Rectovaginal No rectal exam completed today since patient had no rectal complaints. No skin abnormalities observed on exam.     Smoking History: Patient is a current smoker. Discussed smoking cessation with patient. Referred to the [facility] Quitline.  Patient Navigation: Patient  education provided. Access to services provided for patient through [facility] program.    Breast and Cervical Cancer Risk Assessment: Patient has family history of three maternal aunts and a paternal aunt having breast cancer. Patient has no known genetic mutations or history of radiation treatment to the chest before age 48. Patient does not have history of cervical dysplasia, immunocompromised, or DES exposure in-utero.  Risk Assessment     Risk Scores       05/06/2022 06/10/2020   Last edited by: Meryl Dare, CMA Meryl Dare, CMA   5-year risk: 0.3 % 0.2 %   Lifetime risk: 8.1 % 8.2 %            A: B[facility]CP exam with pap smear Complaint of bilateral nipple discharge since prior to previous exam 06/10/2020.  P: Referred patient to the [facility] for a diagnostic mammogram per recommendation. Appointment scheduled Thursday, May 06, 2022.  Carmen Heidelberg, RN 05/06/2022 8:43 AM

## 2022-05-06 NOTE — Patient Instructions (Signed)
Explained breast self awareness with Verita Schneiders. Pap smear completed today. Let her know if today's Pap smear is normal and HPV negative that her next Pap smear will be due in one year due to her history of abnormal Pap smears. Referred patient to the [facility] for a diagnostic mammogram per recommendation. Appointment scheduled Thursday, May 06, 2022. Patient aware of appointment and will be there. Let patient know will follow up with her within the next couple weeks with results of Pap smear by phone. Verita Schneiders verbalized understanding.  Aurelia Gras, Kathaleen Maser, RN 8:44 AM

## 2022-05-06 NOTE — Addendum Note (Signed)
Addended by: Lucilla Lame E on: 05/06/2022 09:10 AM   Modules accepted: Orders

## 2022-05-12 ENCOUNTER — Telehealth: Payer: Self-pay

## 2022-05-12 LAB — CYTOLOGY - PAP
Adequacy: ABSENT
Comment: NEGATIVE
Diagnosis: NEGATIVE
High risk HPV: NEGATIVE

## 2022-05-12 NOTE — Telephone Encounter (Signed)
Patient informed Pap/HPV results negative, next pap due in 1 year. Also informed, results suggestive of bacterial vaginitis. If having symptoms or prefers can send rx metronidazole to pharmacy. Patient stated that she is fine, no symptoms, prefers to not to take prescription at this time. Patient informed to call back if develops symptoms or decides to get rx. Patient verbalized understanding.

## 2022-05-13 ENCOUNTER — Encounter: Payer: No Typology Code available for payment source | Admitting: Family Medicine

## 2022-05-13 ENCOUNTER — Telehealth: Payer: No Typology Code available for payment source | Admitting: Family Medicine

## 2022-05-13 DIAGNOSIS — F32A Depression, unspecified: Secondary | ICD-10-CM

## 2022-05-13 NOTE — Patient Instructions (Signed)
Call [phone] for resources and assistance We hope you feel better soon.   Depression Screening Depression screening is a tool that your health care provider can use to learn if you have symptoms of depression. Depression is a common condition with many symptoms that are also often found in other conditions. Depression is treatable, but it must first be diagnosed. You may not know that certain feelings, thoughts, and behaviors that you are having can be symptoms of depression. Taking a depression screening test can help you and your health care provider decide if you need more assessment, or if you should be referred to a mental health care provider. What are the screening tests? You may have a physical exam to see if another condition is affecting your mental health. You may have a blood or urine sample taken during the physical exam. You may be interviewed or offered a written test using a screening tool that was developed from research, such as one of these: Patient Health Questionnaire (PHQ). This is a set of either 2 or 9 questions. A health care provider who has been trained to score this screening test uses a guide to assess if your symptoms suggest that you may have depression. Hamilton Depression Rating Scale (HAM-D). This is a set of either 17 or 24 questions. You may be asked to take it again during or after your treatment, to see if your depression has gotten better. Beck Depression Inventory (BDI). This is a set of 21 multiple choice questions. Your health care provider scores your answers to assess: Your level of depression, ranging from mild to severe. Your response to treatment. Your health care provider may talk with you about your daily activities, such as eating, sleeping, work, and recreation, and ask if you have had any changes in activity. Your health care provider may ask you to see a mental health specialist, such as a psychiatrist or psychologist, for more  evaluation. Who should be screened for depression?  All adults, including adults with a family history of a mental health disorder. People who are 26-72 years old. People who are recovering from an acute condition, such as myocardial infarction (MI) or stroke. Pregnant women, or women who have given birth. People who have a long-term (chronic) illness. Anyone who has been diagnosed with another type of mental health disorder. Anyone who has symptoms that could show depression. What do my results mean? Your health care provider will review the results of your depression screening, physical exam, and lab tests. Positive screens suggest that you may have depression. Screening is the first step in getting the care that you may need. It will be important for you to know the results of your tests. Ask your health care provider, or the department that is doing your screening tests, when your results will be ready. Talk with your health care provider about your results, diagnosis, and recommendations for follow-up. A diagnosis of depression is made using information from the Diagnostic and Statistical Manual of Mental Disorders (DSM-5). This is a book that lists the number and type of symptoms that must be present for a health care provider to give a specific diagnosis. Your health care provider may work with you to treat your symptoms of depression, or your health care provider may help you find a mental health provider who can assess and help you develop a plan to treat your depression. Get help right away if: You have thoughts about hurting yourself or others. If you ever feel like  you may hurt yourself or others, or have thoughts about taking your own life, get help right away. Go to your nearest emergency department or: Call your local emergency services (911 in the [place] Call a suicide crisis helpline, such as the National Suicide Prevention Lifeline at 2064142948 or 988 in the [place] This is  open 24 hours a day in the [place] Text the Crisis Text Line at 913 253 8028 (in the [place] Summary Depression screening is the first step in getting the help that you may need. If your screening test shows symptoms of depression (is positive), your health care provider may ask you to see a mental health provider who will help identify ways to treat your depression. Anyone aged 40 or older should be screened for depression. This information is not intended to replace advice given to you by your health care provider. Make sure you discuss any questions you have with your health care provider. Document Revised: 03/11/2021 Document Reviewed: 11/24/2020 Elsevier Patient Education  2023 [employer].

## 2022-05-13 NOTE — Progress Notes (Signed)
[place]   Needs referral and resources for behavioral health Provided with [phone] number to call for triage and services for referral and needs. Could speak to GYN also about needs if PCP is no available at this time.  Patient acknowledged agreement and understanding of the plan.

## 2022-05-13 NOTE — Progress Notes (Signed)
Duplicate. Error

## 2022-05-14 ENCOUNTER — Ambulatory Visit: Payer: Self-pay

## 2022-06-22 ENCOUNTER — Emergency Department (HOSPITAL_COMMUNITY): Payer: Commercial Managed Care - HMO

## 2022-06-22 ENCOUNTER — Other Ambulatory Visit: Payer: Self-pay

## 2022-06-22 ENCOUNTER — Emergency Department (HOSPITAL_COMMUNITY)
Admission: EM | Admit: 2022-06-22 | Discharge: 2022-06-22 | Discharge status: Against Medical Advice | Payer: Commercial Managed Care - HMO | Attending: Emergency Medicine | Admitting: Emergency Medicine

## 2022-06-22 ENCOUNTER — Encounter (HOSPITAL_COMMUNITY): Payer: Self-pay

## 2022-06-22 DIAGNOSIS — J029 Acute pharyngitis, unspecified: Secondary | ICD-10-CM | POA: Insufficient documentation

## 2022-06-22 DIAGNOSIS — F17200 Nicotine dependence, unspecified, uncomplicated: Secondary | ICD-10-CM | POA: Insufficient documentation

## 2022-06-22 DIAGNOSIS — Z5321 Procedure and treatment not carried out due to patient leaving prior to being seen by health care provider: Secondary | ICD-10-CM | POA: Insufficient documentation

## 2022-06-22 DIAGNOSIS — R062 Wheezing: Secondary | ICD-10-CM | POA: Diagnosis not present

## 2022-06-22 DIAGNOSIS — R059 Cough, unspecified: Secondary | ICD-10-CM | POA: Insufficient documentation

## 2022-06-22 DIAGNOSIS — R0602 Shortness of breath: Secondary | ICD-10-CM | POA: Insufficient documentation

## 2022-06-22 LAB — BASIC METABOLIC PANEL
Anion gap: 8 (ref 5–15)
BUN: 8 mg/dL (ref 6–20)
CO2: 25 mmol/L (ref 22–32)
Calcium: 9 mg/dL (ref 8.9–10.3)
Chloride: 105 mmol/L (ref 98–111)
Creatinine, Ser: 0.61 mg/dL (ref 0.44–1.00)
GFR, Estimated: >60 mL/min (ref >60)
Glucose, Bld: 96 mg/dL (ref 70–99)
Potassium: 3.6 mmol/L (ref 3.5–5.1)
Sodium: 138 mmol/L (ref 135–145)

## 2022-06-22 LAB — CBC
HCT: 40.9 % (ref 36.0–46.0)
Hemoglobin: 13.1 g/dL (ref 12.0–15.0)
MCH: 31.8 pg (ref 26.0–34.0)
MCHC: 32 g/dL (ref 30.0–36.0)
MCV: 99.3 fL (ref 80.0–100.0)
Platelets: 454 10*3/uL — ABNORMAL HIGH (ref 150–400)
RBC: 4.12 MIL/uL (ref 3.87–5.11)
RDW: 12.4 % (ref 11.5–15.5)
WBC: 16.6 10*3/uL — ABNORMAL HIGH (ref 4.0–10.5)
nRBC: 0 % (ref 0.0–0.2)

## 2022-06-22 MED ORDER — ACETAMINOPHEN 325 MG PO TABS: 650.0000 mg | ORAL_TABLET | Freq: Once | ORAL | Status: DC

## 2022-06-22 MED ORDER — ALBUTEROL SULFATE HFA 108 (90 BASE) MCG/ACT IN AERS
2.0000 | INHALATION_SPRAY | RESPIRATORY_TRACT | Status: DC | PRN
  Administered 2022-06-22: 2 via RESPIRATORY_TRACT
  Filled 2022-06-22: qty 6.7

## 2022-06-22 NOTE — ED Triage Notes (Addendum)
Patient states that she began having a cold last week,(cough, headache,sore throat) SOB and wheezing 3 days ago.

## 2022-06-22 NOTE — ED Provider Triage Note (Signed)
Emergency Medicine Provider Triage Evaluation Note  Carmen Noble , a 36 y.o. female  was evaluated in triage.  Pt complains of shortness of breath, sore throat, cough, fever, chills for the last 8 days.  Patient reports that she initially had symptoms starting last Monday, for several days just felt like a head cold with some upper respiratory congestion, over the last 3 days she has had significant difficulty breathing, cough, sore throat, feels that she is gone significantly downhill.  She denies any chest pain at rest.  She denies history of asthma, COPD, she endorses half pack per day smoking.  She denies any nausea, vomiting, dysuria, abdominal pain..  Review of Systems  Positive: Cough, shob, fever Negative: Chest pain, nvd  Physical Exam  BP 135/77 (BP Location: Left Arm)   Pulse (!) 115   Temp 99.2 F (37.3 C) (Oral)   Resp (!) 30   Ht 5\' 3"  (1.6 m)   Wt 79.4 kg   SpO2 93%   BMI 31.00 kg/m  Gen:   Awake, no distress   Resp:  Normal effort  MSK:   Moves extremities without difficulty  Other:  No significant posterior oropharynx exudate, minimal erythema, she has some biphasic wheezing and rhonchi, largely in expiratory phase after albuterol x1.  No significant focal consolidation auscultated.  Medical Decision Making  Medically screening exam initiated at 3:39 PM.  Appropriate orders placed.  Carmen Noble was informed that the remainder of the evaluation will be completed by another provider, this initial triage assessment does not replace that evaluation, and the importance of remaining in the ED until their evaluation is complete.  Workup initiated   Carmen Noble, [place] 06/22/22 1540

## 2023-04-11 ENCOUNTER — Encounter: Payer: Self-pay | Admitting: Nurse Practitioner

## 2023-04-11 DIAGNOSIS — Z1231 Encounter for screening mammogram for malignant neoplasm of breast: Secondary | ICD-10-CM

## 2023-04-22 ENCOUNTER — Other Ambulatory Visit: Payer: Self-pay | Admitting: Nurse Practitioner

## 2023-04-22 DIAGNOSIS — N644 Mastodynia: Secondary | ICD-10-CM

## 2023-05-17 ENCOUNTER — Ambulatory Visit: Payer: No Typology Code available for payment source

## 2023-05-17 ENCOUNTER — Ambulatory Visit
Admission: RE | Admit: 2023-05-17 | Discharge: 2023-05-17 | Disposition: A | Discharge status: Home / Self Care | Payer: Medicaid Other | Source: Ambulatory Visit | Attending: Nurse Practitioner | Admitting: Nurse Practitioner

## 2023-05-17 DIAGNOSIS — N644 Mastodynia: Secondary | ICD-10-CM

## 2023-06-21 ENCOUNTER — Ambulatory Visit: Payer: No Typology Code available for payment source | Admitting: Registered"
